# Patient Record
Sex: Female | Born: 1995
Health system: Southern US, Community
[De-identification: ages and names within clinical notes are randomized; demographics above are authoritative.]

## PROBLEM LIST (undated history)

## (undated) ENCOUNTER — Inpatient Hospital Stay (HOSPITAL_COMMUNITY): Payer: Self-pay

## (undated) HISTORY — PX: INDUCED ABORTION: SHX677

## (undated) HISTORY — PX: WISDOM TOOTH EXTRACTION: SHX21

---
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
## 2013-10-08 ENCOUNTER — Encounter (HOSPITAL_COMMUNITY): Payer: Self-pay | Admitting: *Deleted

## 2013-11-13 ENCOUNTER — Emergency Department (HOSPITAL_COMMUNITY)
Admission: EM | Admit: 2013-11-13 | Discharge: 2013-11-13 | Disposition: A | Discharge status: Home / Self Care | Payer: Medicaid Other | Attending: Emergency Medicine | Admitting: Emergency Medicine

## 2013-11-13 ENCOUNTER — Encounter (HOSPITAL_COMMUNITY): Payer: Self-pay | Admitting: Emergency Medicine

## 2013-11-13 DIAGNOSIS — N925 Other specified irregular menstruation: Secondary | ICD-10-CM | POA: Insufficient documentation

## 2013-11-13 DIAGNOSIS — N949 Unspecified condition associated with female genital organs and menstrual cycle: Secondary | ICD-10-CM | POA: Insufficient documentation

## 2013-11-13 DIAGNOSIS — Z3202 Encounter for pregnancy test, result negative: Secondary | ICD-10-CM | POA: Insufficient documentation

## 2013-11-13 DIAGNOSIS — N938 Other specified abnormal uterine and vaginal bleeding: Secondary | ICD-10-CM | POA: Insufficient documentation

## 2013-11-13 LAB — I-STAT CHEM 8, ED
BUN: 9 mg/dL (ref 6–23)
Calcium, Ion: 1.29 mmol/L — ABNORMAL HIGH (ref 1.12–1.23)
Chloride: 101 mEq/L (ref 96–112)
Creatinine, Ser: 0.9 mg/dL (ref 0.50–1.10)
Glucose, Bld: 90 mg/dL (ref 70–99)
HCT: 44 % (ref 36.0–46.0)
HEMOGLOBIN: 15 g/dL (ref 12.0–15.0)
POTASSIUM: 3.9 meq/L (ref 3.7–5.3)
SODIUM: 142 meq/L (ref 137–147)
TCO2: 28 mmol/L (ref 0–100)

## 2013-11-13 LAB — PREGNANCY, URINE: PREG TEST UR: NEGATIVE

## 2013-11-13 LAB — WET PREP, GENITAL
Clue Cells Wet Prep HPF POC: NONE SEEN
TRICH WET PREP: NONE SEEN
YEAST WET PREP: NONE SEEN

## 2013-11-13 MED ORDER — ACETAMINOPHEN 500 MG PO TABS
1000.0000 mg | ORAL_TABLET | Freq: Once | ORAL | Status: AC
  Administered 2013-11-13: 1000 mg via ORAL
  Filled 2013-11-13: qty 2

## 2013-11-13 NOTE — ED Provider Notes (Signed)
CSN: [id number]     Arrival date & time 11/13/13  1051 History  This chart was scribed for non-physician practitioner, Wynetta EmeryNicole Pisciotta, PA-C,working with Vanetta MuldersScott Zackowski, MD, by Karle PlumberJennifer Tensley, ED Scribe.  This patient was seen in room WA25/WA25 and the patient's care was started at 12:59 PM.  Chief Complaint  Patient presents with  . Vaginal Bleeding  . Abdominal Cramping   The history is provided by the patient. No language interpreter was used.   HPI Comments:  Megan OliphantJasmine T Aker is a 18 y.o. female who presents to the Emergency Department complaining of heavy bleeding with clots and abdominal pain that started two days ago. She reports her pain is a 7/10.  Pt states she normally has irregular, light periods. She states she was soaking through one pad per hour starting yesterday morning. She states the bleeding gradually lessened throughout the day but is still heavy. She states she has been wearing the same pad this morning without soaking through it. She reports taking one Ibuprofen yesterday with no relief. She denies taking anything for pain today. Pt reports normal urination and bowel movements. She denies heart palpitations, CP, SOB, nausea, or vomiting. Pt reports she does not regularly see an OB/GYN. Her LMP ended on June 1st and she reports it being light and lasting approximately four days.   Past Medical History  Diagnosis Date  . Premature delivery   . Preterm labor    Past Surgical History  Procedure Laterality Date  . No past surgeries     Family History  Problem Relation Age of Onset  . Other Neg Hx    History  Substance Use Topics  . Smoking status: Never Smoker   . Smokeless tobacco: Never Used  . Alcohol Use: No   OB History   Grav Para Term Preterm Abortions TAB SAB Ect Mult Living   3 1  1 1  0 0 0  1     Review of Systems A complete 10 system review of systems was obtained and all systems are negative except as noted in the HPI and PMH.   Allergies   Review of patient's allergies indicates no known allergies.  Home Medications   Prior to Admission medications   Medication Sig Start Date End Date Taking? Authorizing Tanaja Ganger  ibuprofen (ADVIL,MOTRIN) 200 MG tablet Take 400 mg by mouth every 6 (six) hours as needed for fever or moderate pain.   Yes Historical Rasheedah Reis, MD   Triage Vitals: BP 115/78  Pulse 78  Temp(Src) 98.7 F (37.1 C) (Oral)  Resp 16  SpO2 100%  LMP 10/13/2013  Breastfeeding? No Physical Exam  Nursing note and vitals reviewed. Constitutional: She is oriented to person, place, and time. She appears well-developed and well-nourished.  HENT:  Head: Normocephalic and atraumatic.  Mouth/Throat: Oropharynx is clear and moist.  No conjunctival pallor  Eyes: EOM are normal.  Neck: Normal range of motion.  Cardiovascular: Normal rate, regular rhythm and intact distal pulses.   Pulmonary/Chest: Effort normal and breath sounds normal. No respiratory distress. She has no wheezes. She has no rales. She exhibits no tenderness.  Abdominal: Soft. She exhibits no distension and no mass. There is tenderness. There is no rebound and no guarding.  Mild tenderness to deep palpation in the left lower cord drug with no guarding or rebound  Genitourinary:  No rashes or lesions, dark blood pooled in the posterior fourchette. No bleeding observed from cervix. No cervical abnormalities. There is no cervical motion or adnexal  tenderness.  Musculoskeletal: Normal range of motion.  Neurological: She is alert and oriented to person, place, and time.  Skin: Skin is warm and dry.  Psychiatric: She has a normal mood and affect. Her behavior is normal.    ED Course  Procedures (including critical care time) DIAGNOSTIC STUDIES: Oxygen Saturation is 100% on RA, normal by my interpretation.   COORDINATION OF CARE: 1:04 PM- Will order blood work and perform pelvic exam. Pt verbalizes understanding and agrees to plan.  Medications   acetaminophen (TYLENOL) tablet 1,000 mg (1,000 mg Oral Given 11/13/13 1325)    Labs Review Labs Reviewed  WET PREP, GENITAL - Abnormal; Notable for the following:    WBC, Wet Prep HPF POC FEW (*)    All other components within normal limits  I-STAT CHEM 8, ED - Abnormal; Notable for the following:    Calcium, Ion 1.29 (*)    All other components within normal limits  GC/CHLAMYDIA PROBE AMP  PREGNANCY, URINE    Imaging Review No results found.   EKG Interpretation None      MDM   Final diagnoses:  DUB (dysfunctional uterine bleeding)    Filed Vitals:   11/13/13 1109  BP: 115/78  Pulse: 78  Temp: 98.7 F (37.1 C)  TempSrc: Oral  Resp: 16  SpO2: 100%    Medications  acetaminophen (TYLENOL) tablet 1,000 mg (1,000 mg Oral Given 11/13/13 1325)    Megan OliphantJasmine T [name] is a 18 y.o. female presenting with cramping and heavy vaginal bleeding. Patient is not pregnant, there are no signs of anemia. Wet prep with no abnormalities. I advised patient to follow with OB/GYN.  Evaluation does not show pathology that would require ongoing emergent intervention or inpatient treatment. Pt is hemodynamically stable and mentating appropriately. Discussed findings and plan with patient/guardian, who agrees with care plan. All questions answered. Return precautions discussed and outpatient follow up given.   I personally performed the services described in this documentation, which was scribed in my presence. The recorded information has been reviewed and is accurate.    Wynetta Emeryicole Pisciotta, PA-C 11/13/13 1545

## 2013-11-13 NOTE — ED Provider Notes (Signed)
Medical screening examination/treatment/procedure(s) were performed by non-physician practitioner and as supervising physician I was immediately available for consultation/collaboration.   EKG Interpretation None        Vanetta MuldersScott Zackowski, MD 11/13/13 1549

## 2013-11-13 NOTE — ED Notes (Signed)
Pt was told she would be d/c after wet prep resulted. Patient not in room upon re-assessment. [facility] aware.

## 2013-11-13 NOTE — Discharge Instructions (Signed)
Do not hesitate to return to the Emergency Department for any new, worsening or concerning symptoms.   If you do not have a primary care doctor you can establish one at the   [facility]: [address] [place] [id number] [phone]  After you establish care. Let them know you were seen in the emergency room. They must obtain records for further management.    Abnormal Uterine Bleeding Abnormal uterine bleeding can affect women at various stages in life, including teenagers, women in their reproductive years, pregnant women, and women who have reached menopause. Several kinds of uterine bleeding are considered abnormal, including:  Bleeding or spotting between periods.   Bleeding after sexual intercourse.   Bleeding that is heavier or more than normal.   Periods that last longer than usual.  Bleeding after menopause.  Many cases of abnormal uterine bleeding are minor and simple to treat, while others are more serious. Any type of abnormal bleeding should be evaluated by your health care provider. Treatment will depend on the cause of the bleeding. HOME CARE INSTRUCTIONS Monitor your condition for any changes. The following actions may help to alleviate any discomfort you are experiencing:  Avoid the use of tampons and douches as directed by your health care provider.  Change your pads frequently. You should get regular pelvic exams and Pap tests. Keep all follow-up appointments for diagnostic tests as directed by your health care provider.  SEEK MEDICAL CARE IF:   Your bleeding lasts more than 1 week.   You feel dizzy at times.  SEEK IMMEDIATE MEDICAL CARE IF:   You pass out.   You are changing pads every 15 to 30 minutes.   You have abdominal pain.  You have a fever.   You become sweaty or weak.   You are passing large blood clots from the vagina.   You start to feel nauseous and vomit. MAKE SURE YOU:   Understand these  instructions.  Will watch your condition.  Will get help right away if you are not doing well or get worse. Document Released: 05/05/2005 Document Revised: 05/10/2013 Document Reviewed: 12/02/2012 [facility] Patient Information 2015 [place]. This information is not intended to replace advice given to you by your health care provider. Make sure you discuss any questions you have with your health care provider.

## 2013-11-13 NOTE — ED Notes (Signed)
Ambulated Pt to restroom. Never saw Pt return to room. Went to go into room to update VS and pt was gone. Notified RN [employer]

## 2013-11-13 NOTE — ED Notes (Addendum)
Pt from home c/o heavy vaginal bleeding with abd cramping x1 day. Pt states that she had her LMP June 1st and should not have started yet. Pt adds that she has never had heavy bleeding or pain before. Pt is A&O and in NAD. Pt is using approx 2 pads per hour. Pt states that she had abortion July 2014.

## 2013-11-14 LAB — GC/CHLAMYDIA PROBE AMP
CT Probe RNA: NEGATIVE
GC Probe RNA: NEGATIVE

## 2014-02-07 ENCOUNTER — Emergency Department (HOSPITAL_COMMUNITY): Payer: Self-pay

## 2014-02-07 ENCOUNTER — Ambulatory Visit (HOSPITAL_COMMUNITY): Payer: Medicaid Other

## 2014-02-07 ENCOUNTER — Emergency Department (HOSPITAL_COMMUNITY)
Admission: EM | Admit: 2014-02-07 | Discharge: 2014-02-07 | Discharge status: Against Medical Advice | Payer: Medicaid Other | Attending: Emergency Medicine | Admitting: Emergency Medicine

## 2014-02-07 ENCOUNTER — Encounter (HOSPITAL_COMMUNITY): Payer: Self-pay | Admitting: Emergency Medicine

## 2014-02-07 DIAGNOSIS — L293 Anogenital pruritus, unspecified: Secondary | ICD-10-CM | POA: Insufficient documentation

## 2014-02-07 DIAGNOSIS — N939 Abnormal uterine and vaginal bleeding, unspecified: Secondary | ICD-10-CM

## 2014-02-07 DIAGNOSIS — N898 Other specified noninflammatory disorders of vagina: Secondary | ICD-10-CM

## 2014-02-07 DIAGNOSIS — Z3202 Encounter for pregnancy test, result negative: Secondary | ICD-10-CM | POA: Insufficient documentation

## 2014-02-07 LAB — COMPREHENSIVE METABOLIC PANEL
ALT: 17 U/L (ref 0–35)
AST: 16 U/L (ref 0–37)
Albumin: 3.8 g/dL (ref 3.5–5.2)
Alkaline Phosphatase: 74 U/L (ref 39–117)
Anion gap: 14 (ref 5–15)
BILIRUBIN TOTAL: 0.4 mg/dL (ref 0.3–1.2)
BUN: 11 mg/dL (ref 6–23)
CHLORIDE: 102 meq/L (ref 96–112)
CO2: 23 meq/L (ref 19–32)
Calcium: 9.7 mg/dL (ref 8.4–10.5)
Creatinine, Ser: 0.77 mg/dL (ref 0.50–1.10)
GFR calc Af Amer: >90 mL/min (ref >90)
Glucose, Bld: 103 mg/dL — ABNORMAL HIGH (ref 70–99)
POTASSIUM: 4.4 meq/L (ref 3.7–5.3)
SODIUM: 139 meq/L (ref 137–147)
Total Protein: 8.2 g/dL (ref 6.0–8.3)

## 2014-02-07 LAB — URINALYSIS, ROUTINE W REFLEX MICROSCOPIC
Bilirubin Urine: NEGATIVE
GLUCOSE, UA: NEGATIVE mg/dL
KETONES UR: NEGATIVE mg/dL
Nitrite: NEGATIVE
PH: 6 (ref 5.0–8.0)
Protein, ur: 30 mg/dL — AB
Specific Gravity, Urine: 1.035 — ABNORMAL HIGH (ref 1.005–1.030)
Urobilinogen, UA: 1 mg/dL (ref 0.0–1.0)

## 2014-02-07 LAB — CBC WITH DIFFERENTIAL/PLATELET
BASOS ABS: 0 10*3/uL (ref 0.0–0.1)
Basophils Relative: 0 % (ref 0–1)
Eosinophils Absolute: 0.1 10*3/uL (ref 0.0–0.7)
Eosinophils Relative: 1 % (ref 0–5)
HCT: 39.6 % (ref 36.0–46.0)
Hemoglobin: 12.5 g/dL (ref 12.0–15.0)
LYMPHS PCT: 44 % (ref 12–46)
Lymphs Abs: 3.8 10*3/uL (ref 0.7–4.0)
MCH: 23.1 pg — ABNORMAL LOW (ref 26.0–34.0)
MCHC: 31.6 g/dL (ref 30.0–36.0)
MCV: 73.1 fL — ABNORMAL LOW (ref 78.0–100.0)
Monocytes Absolute: 0.6 10*3/uL (ref 0.1–1.0)
Monocytes Relative: 7 % (ref 3–12)
NEUTROS ABS: 4.2 10*3/uL (ref 1.7–7.7)
Neutrophils Relative %: 48 % (ref 43–77)
PLATELETS: 473 10*3/uL — AB (ref 150–400)
RBC: 5.42 MIL/uL — ABNORMAL HIGH (ref 3.87–5.11)
RDW: 16.5 % — AB (ref 11.5–15.5)
WBC: 8.7 10*3/uL (ref 4.0–10.5)

## 2014-02-07 LAB — WET PREP, GENITAL
CLUE CELLS WET PREP: NONE SEEN
Trich, Wet Prep: NONE SEEN
Yeast Wet Prep HPF POC: NONE SEEN

## 2014-02-07 LAB — URINE MICROSCOPIC-ADD ON

## 2014-02-07 LAB — POC URINE PREG, ED: Preg Test, Ur: NEGATIVE

## 2014-02-07 MED ORDER — SODIUM CHLORIDE 0.9 % IV BOLUS (SEPSIS): 1000.0000 mL | Freq: Once | INTRAVENOUS | Status: DC

## 2014-02-07 MED ORDER — LIDOCAINE HCL 1 % IJ SOLN
INTRAMUSCULAR | Status: AC
  Administered 2014-02-07: 20 mL
  Filled 2014-02-07: qty 20

## 2014-02-07 MED ORDER — CEFTRIAXONE SODIUM 250 MG IJ SOLR
250.0000 mg | Freq: Once | INTRAMUSCULAR | Status: AC
  Administered 2014-02-07: 250 mg via INTRAMUSCULAR
  Filled 2014-02-07: qty 250

## 2014-02-07 MED ORDER — AZITHROMYCIN 250 MG PO TABS
1000.0000 mg | ORAL_TABLET | Freq: Once | ORAL | Status: AC
  Administered 2014-02-07: 1000 mg via ORAL
  Filled 2014-02-07: qty 4

## 2014-02-07 NOTE — ED Notes (Signed)
Pt does not want any further tests and treatment at this time--- stated that she wanted to go home right now because her "ride is here and it's the one and only ride she has".

## 2014-02-07 NOTE — ED Provider Notes (Signed)
CSN: [id number]     Arrival date & time 02/07/14  1706 History   First MD Initiated Contact with Patient 02/07/14 1818     Chief Complaint  Patient presents with  . Vaginal Itching  . Vaginal Bleeding     (Consider location/radiation/quality/duration/timing/severity/associated sxs/prior Treatment) The history is provided by the patient. No language interpreter was used.  Megan [name] is a 18 y/o F with PMHx of premature delivery and labor presenting to the ED with vaginal bleeding and vaginal itching. Patient reported that the vaginal bleeding started 3 days ago - reported that she has been having to wear a pad and change it at least 2 times per day. Reported that she just finished having an episode of vaginal bleeding earlier in the month, stated that she had an episode of vaginal bleeding that lasted approximately 4 months - reported that she was never followed up with OBGYN or had an US performed. Reported that her menstrual cycle has always been irregular. Stated that she started to have vaginal itching yesterday and stated that she also started to experience burning with urination - believes this to be from the vaginal pruritis. Stated that she has history of Chlamydia and is concerned that she is infected again - reported that she was diagnosed with this approximately 2 years ago. Reported that she is sexually active, stated that she does use protection - last encounter 2 weeks ago. Denied being on birth control. Patient requested to be tested for STDs. Denied abdominal cramping, abdominal pain, fever, chills, nausea, vomiting, diarrhea, hematochezia, dizziness, headache, blurred vision, sudden loss of vision, vaginal pain, vaginal discharge. PCP none  Past Medical History  Diagnosis Date  . Premature delivery   . Preterm labor    Past Surgical History  Procedure Laterality Date  . No past surgeries     Family History  Problem Relation Age of Onset  . Other Neg Hx    History   Substance Use Topics  . Smoking status: Never Smoker   . Smokeless tobacco: Never Used  . Alcohol Use: No   OB History   Grav Para Term Preterm Abortions TAB SAB Ect Mult Living   0 0 0  1     Review of Systems  Constitutional: Negative for fever and chills.  Respiratory: Negative for chest tightness and shortness of breath.   Cardiovascular: Negative for chest pain.  Gastrointestinal: Negative for nausea, vomiting, abdominal pain, diarrhea, constipation, blood in stool and anal bleeding.  Genitourinary: Positive for dysuria and vaginal bleeding. Negative for hematuria, vaginal discharge, vaginal pain and pelvic pain.  Musculoskeletal: Positive for back pain.  Neurological: Negative for dizziness, weakness and headaches.      Allergies  Review of patient's allergies indicates no known allergies.  Home Medications   Prior to Admission medications   Not on File   BP 115/70  Pulse 70  Temp(Src) 98 F (36.7 C) (Oral)  Resp 16  SpO2 100%  LMP 01/20/2014 Physical Exam  Nursing note and vitals reviewed. Constitutional: She is oriented to person, place, and time. She appears well-developed and well-nourished. No distress.  HENT:  Head: Normocephalic and atraumatic.  Mouth/Throat: Oropharynx is clear and moist. No oropharyngeal exudate.  Eyes: Conjunctivae and EOM are normal. Pupils are equal, round, and reactive to light. Right eye exhibits no discharge. Left eye exhibits no discharge.  Neck: Normal range of motion. Neck supple. No tracheal deviation present.  Cardiovascular: Normal rate, regular rhythm  and normal heart sounds.  Exam reveals no friction rub.   No murmur heard. Pulses:      Radial pulses are 2+ on the right side, and 2+ on the left side.       Dorsalis pedis pulses are 2+ on the right side, and 2+ on the left side.  Pulmonary/Chest: Effort normal and breath sounds normal. No respiratory distress. She has no wheezes. She has no rales.  Patient is  able to speak in full sentences without difficulty  Negative use of accessory muscles Negative stridor  Abdominal: Soft. Bowel sounds are normal. She exhibits no distension. There is no tenderness. There is no rebound and no guarding.  Negative abdominal distension  BS normoactive in all 4 quadrants Abdomen soft upon palpation  Negative pain upon palpation to the abdomen  Negative peritoneal signs, guarding, rigidity   Genitourinary:  Pelvic Exam: Negative swelling, erythema, inflammation, lesions, sores, deformities identified to the external genitalia and vaginal canal. Bright red blood noted in the vaginal vault coming from the cervix. Negative vaginal discharge noted. Positive CMT tenderness and discomfort in the suprapubic region when pressure is applied. Negative bilateral adnexal tenderness. Exam chaperoned with Tech   Musculoskeletal: Normal range of motion.  Full ROM to upper and lower extremities without difficulty noted, negative ataxia noted.  Lymphadenopathy:    She has no cervical adenopathy.  Neurological: She is alert and oriented to person, place, and time. No cranial nerve deficit. She exhibits normal muscle tone. Coordination normal.  Skin: Skin is warm and dry. No rash noted. She is not diaphoretic. No erythema.  Psychiatric: She has a normal mood and affect. Her behavior is normal. Thought content normal.    ED Course  Procedures (including critical care time)  8:13 PM This provider was made aware that the patient needs to leave. Reported that her ride is here and that she needs to go and can no longer stay for further imaging/labs to be performed.   Results for orders placed during the hospital encounter of 02/07/14  WET PREP, GENITAL      Result Value Ref Range   Yeast Wet Prep HPF POC NONE SEEN  NONE SEEN   Trich, Wet Prep NONE SEEN  NONE SEEN   Clue Cells Wet Prep HPF POC NONE SEEN  NONE SEEN   WBC, Wet Prep HPF POC RARE (*) NONE SEEN  CBC WITH DIFFERENTIAL       Result Value Ref Range   WBC 8.7  4.0 - 10.5 K/uL   RBC 5.42 (*) 3.87 - 5.11 MIL/uL   Hemoglobin 12.5  12.0 - 15.0 g/dL   HCT 16.1  09.6 - 04.5 %   MCV 73.1 (*) 78.0 - 100.0 fL   MCH 23.1 (*) 26.0 - 34.0 pg   MCHC 31.6  30.0 - 36.0 g/dL   RDW 40.9 (*) 81.1 - 91.4 %   Platelets 473 (*) 150 - 400 K/uL   Neutrophils Relative % 48  43 - 77 %   Neutro Abs 4.2  1.7 - 7.7 K/uL   Lymphocytes Relative 44  12 - 46 %   Lymphs Abs 3.8  0.7 - 4.0 K/uL   Monocytes Relative 7  3 - 12 %   Monocytes Absolute 0.6  0.1 - 1.0 K/uL   Eosinophils Relative 1  0 - 5 %   Eosinophils Absolute 0.1  0.0 - 0.7 K/uL   Basophils Relative 0  0 - 1 %   Basophils Absolute 0.0  0.0 - 0.1 K/uL  COMPREHENSIVE METABOLIC PANEL      Result Value Ref Range   Sodium 139  137 - 147 mEq/L   Potassium 4.4  3.7 - 5.3 mEq/L   Chloride 102  96 - 112 mEq/L   CO2 23  19 - 32 mEq/L   Glucose, Bld 103 (*) 70 - 99 mg/dL   BUN 11  6 - 23 mg/dL   Creatinine, Ser 1.61  0.50 - 1.10 mg/dL   Calcium 9.7  8.4 - 09.6 mg/dL   Total Protein 8.2  6.0 - 8.3 g/dL   Albumin 3.8  3.5 - 5.2 g/dL   AST 16  0 - 37 U/L   ALT 17  0 - 35 U/L   Alkaline Phosphatase 74  39 - 117 U/L   Total Bilirubin 0.4  0.3 - 1.2 mg/dL   GFR calc non Af Amer >90  >90 mL/min   GFR calc Af Amer >90  >90 mL/min   Anion gap 14  5 - 15  URINALYSIS, ROUTINE W REFLEX MICROSCOPIC      Result Value Ref Range   Color, Urine AMBER (*) YELLOW   APPearance CLOUDY (*) CLEAR   Specific Gravity, Urine 1.035 (*) 1.005 - 1.030   pH 6.0  5.0 - 8.0   Glucose, UA NEGATIVE  NEGATIVE mg/dL   Hgb urine dipstick LARGE (*) NEGATIVE   Bilirubin Urine NEGATIVE  NEGATIVE   Ketones, ur NEGATIVE  NEGATIVE mg/dL   Protein, ur 30 (*) NEGATIVE mg/dL   Urobilinogen, UA 1.0  0.0 - 1.0 mg/dL   Nitrite NEGATIVE  NEGATIVE   Leukocytes, UA MODERATE (*) NEGATIVE  URINE MICROSCOPIC-ADD ON      Result Value Ref Range   RBC / HPF TOO NUMEROUS TO COUNT  <3 RBC/hpf   Urine-Other FIELD  OBSCURED BY RBC'S    POC URINE PREG, ED      Result Value Ref Range   Preg Test, Ur NEGATIVE  NEGATIVE    Labs Review Labs Reviewed  WET PREP, GENITAL - Abnormal; Notable for the following:    WBC, Wet Prep HPF POC RARE (*)    All other components within normal limits  CBC WITH DIFFERENTIAL - Abnormal; Notable for the following:    RBC 5.42 (*)    MCV 73.1 (*)    MCH 23.1 (*)    RDW 16.5 (*)    Platelets 473 (*)    All other components within normal limits  COMPREHENSIVE METABOLIC PANEL - Abnormal; Notable for the following:    Glucose, Bld 103 (*)    All other components within normal limits  URINALYSIS, ROUTINE W REFLEX MICROSCOPIC - Abnormal; Notable for the following:    Color, Urine AMBER (*)    APPearance CLOUDY (*)    Specific Gravity, Urine 1.035 (*)    Hgb urine dipstick LARGE (*)    Protein, ur 30 (*)    Leukocytes, UA MODERATE (*)    All other components within normal limits  GC/CHLAMYDIA PROBE AMP  URINE CULTURE  URINE MICROSCOPIC-ADD ON  RPR  HIV ANTIBODY (ROUTINE TESTING)  POC URINE PREG, ED    Imaging Review No results found.   EKG Interpretation None      MDM   Final diagnoses:  Abnormal uterine bleeding  Vaginal itching    Medications  sodium chloride 0.9 % bolus 1,000 mL (1,000 mLs Intravenous Not Given 02/07/14 2020)  cefTRIAXone (ROCEPHIN) injection 250 mg (250 mg Intramuscular Given  02/07/14 2019)  azithromycin (ZITHROMAX) tablet 1,000 mg (1,000 mg Oral Given 02/07/14 2018)  lidocaine (XYLOCAINE) 1 % (with pres) injection (20 mLs  Given 02/07/14 2016)   Filed Vitals:   02/07/14 1726 02/07/14 1936  BP: 121/84 115/70  Pulse: 69 70  Temp: 98.1 F (36.7 C) 98 F (36.7 C)  TempSrc: Oral Oral  Resp: 16 16  SpO2: 100% 100%    CBC unremarkable-hemoglobin and hematocrit within normal limits. CMP unremarkable. Urine pregnancy negative. Urinalysis noted moderate leukocytes with large amount of hemoglobin-patient is having menstrual bleeding.  Wet prep unremarkable. GC Chlamydia probe, RPR, HIV, urine culture pending. This provider recommended further imaging to be performed and for patient to stay for [place] to be performed regarding abnormal uterine bleeding. Patient unable to stay, stated that she can no longer stay secondary to her ride being here and she has no other way of getting home. Discussed with patient concern and imaging - patient declined. Prophylactic STD medications given. Patient to be signed out AMA. Recommended patient to follow-up with [facility]. Discussed with patient to closely monitor symptoms and if symptoms are to worsen or change to report back to the ED - strict return instructions given.   Raymon Mutton, PA-C 02/07/14 2045

## 2014-02-07 NOTE — ED Notes (Signed)
Per patient, states vaginal bleeding and itching for 3 days

## 2014-02-07 NOTE — Discharge Instructions (Signed)
Please call your doctor for a followup appointment within 24-48 hours. When you talk to your doctor please let them know that you were seen in the emergency department and have them acquire all of your records so that they can discuss the findings with you and formulate a treatment plan to fully care for your new and ongoing problems. Please call and set-up an appointment with OBGYN  Please rest and stay hydrated Please continue to monitor symptoms closely and if symptoms are to worsen or change (fever greater than 101, chills, sweating, nausea, vomiting, chest pain, shortness of breathe, difficulty breathing, weakness, numbness, tingling, worsening or changes to pain pattern, worsening or changes to vaginal bleeding, vaginal pain, abdominal cramping, black tarry stools) please report back to the Emergency Department immediately.    Abnormal Uterine Bleeding Abnormal uterine bleeding can affect women at various stages in life, including teenagers, women in their reproductive years, pregnant women, and women who have reached menopause. Several kinds of uterine bleeding are considered abnormal, including:  Bleeding or spotting between periods.   Bleeding after sexual intercourse.   Bleeding that is heavier or more than normal.   Periods that last longer than usual.  Bleeding after menopause.  Many cases of abnormal uterine bleeding are minor and simple to treat, while others are more serious. Any type of abnormal bleeding should be evaluated by your health care provider. Treatment will depend on the cause of the bleeding. HOME CARE INSTRUCTIONS Monitor your condition for any changes. The following actions may help to alleviate any discomfort you are experiencing:  Avoid the use of tampons and douches as directed by your health care provider.  Change your pads frequently. You should get regular pelvic exams and Pap tests. Keep all follow-up appointments for diagnostic tests as directed by  your health care provider.  SEEK MEDICAL CARE IF:   Your bleeding lasts more than 1 week.   You feel dizzy at times.  SEEK IMMEDIATE MEDICAL CARE IF:   You pass out.   You are changing pads every 15 to 30 minutes.   You have abdominal pain.  You have a fever.   You become sweaty or weak.   You are passing large blood clots from the vagina.   You start to feel nauseous and vomit. MAKE SURE YOU:   Understand these instructions.  Will watch your condition.  Will get help right away if you are not doing well or get worse. Document Released: 05/05/2005 Document Revised: 05/10/2013 Document Reviewed: 12/02/2012 [facility] Patient Information 2015 [place], [place]. This information is not intended to replace advice given to you by your health care provider. Make sure you discuss any questions you have with your health care provider.

## 2014-02-07 NOTE — ED Provider Notes (Signed)
Medical screening examination/treatment/procedure(s) were performed by non-physician practitioner and as supervising physician I was immediately available for consultation/collaboration.   EKG Interpretation None       Juliet Rude. Rubin Payor, MD 02/07/14 [phone]

## 2014-02-08 LAB — GC/CHLAMYDIA PROBE AMP
CT Probe RNA: NEGATIVE
GC Probe RNA: POSITIVE — AB

## 2014-02-08 LAB — RPR

## 2014-02-08 LAB — HIV ANTIBODY (ROUTINE TESTING W REFLEX): HIV 1&2 Ab, 4th Generation: NONREACTIVE

## 2014-02-09 ENCOUNTER — Telehealth (HOSPITAL_COMMUNITY): Payer: Self-pay

## 2014-02-09 LAB — URINE CULTURE
Colony Count: 70000
SPECIAL REQUESTS: NORMAL

## 2014-02-09 NOTE — ED Notes (Incomplete)
Positive gonorrhea with no antibiotics. Chart sent to EDP office for review.

## 2014-02-20 ENCOUNTER — Telehealth (HOSPITAL_BASED_OUTPATIENT_CLINIC_OR_DEPARTMENT_OTHER): Payer: Self-pay

## 2014-02-20 NOTE — Telephone Encounter (Signed)
Pt called after receiving letter.  ID verified x 2.  Pt (+) for gonorrhea.  Was txd appropriately w/Zithromax and Rocephin on her 9/22 visit to [facility].  Pt advised to let partner(s) know so they can tested and treated and sh has not been sexually active since treatment.  [employer] form completed and faxed.

## 2014-02-21 ENCOUNTER — Telehealth (HOSPITAL_BASED_OUTPATIENT_CLINIC_OR_DEPARTMENT_OTHER): Payer: Self-pay | Admitting: Emergency Medicine

## 2014-03-20 ENCOUNTER — Encounter (HOSPITAL_COMMUNITY): Payer: Self-pay | Admitting: Emergency Medicine

## 2014-03-25 IMAGING — US US OB TRANSVAGINAL
1 series · 14 of 28 positions shown · non-contrast
Comparison: 03/08/2012.

CLINICAL DATA: Assess viability

OBSTETRIC <14 WK ULTRASOUND
TECHNIQUE: Transvaginal ultrasound was performed for evaluation
of the gestation as well as the maternal uterus and adnexal
regions.

[Series 1: us ob transvaginal · 14 of 51 slices shown]
[im 2/51]
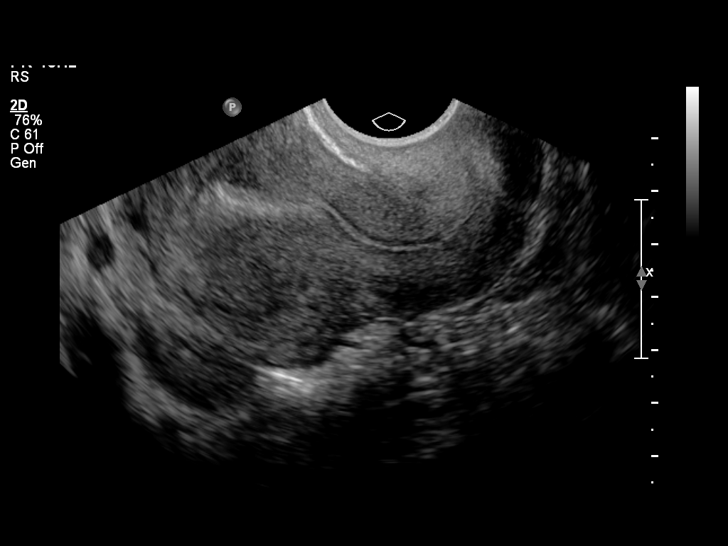
[im 6/51]
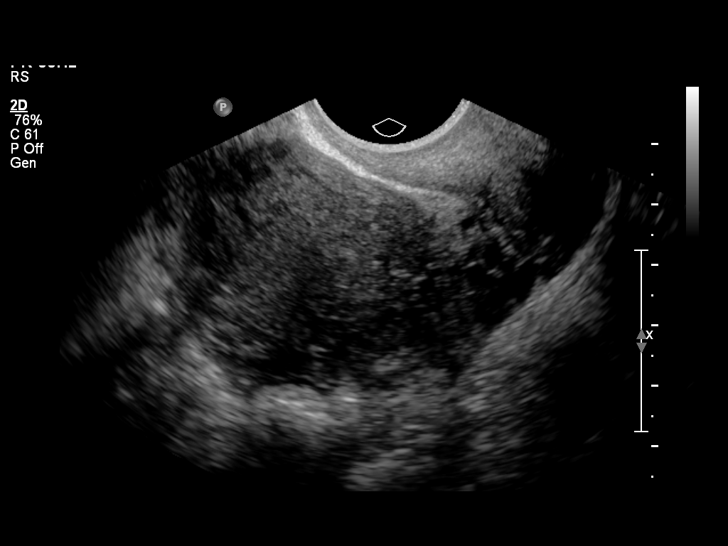
[im 10/51]
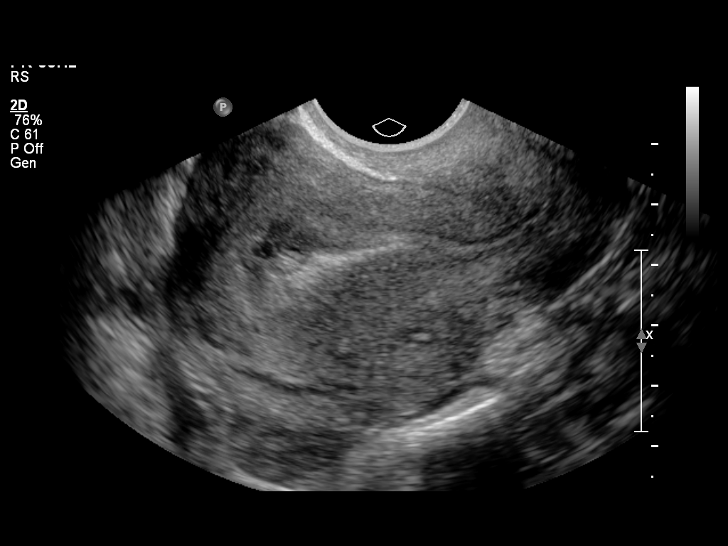
[im 13/51]
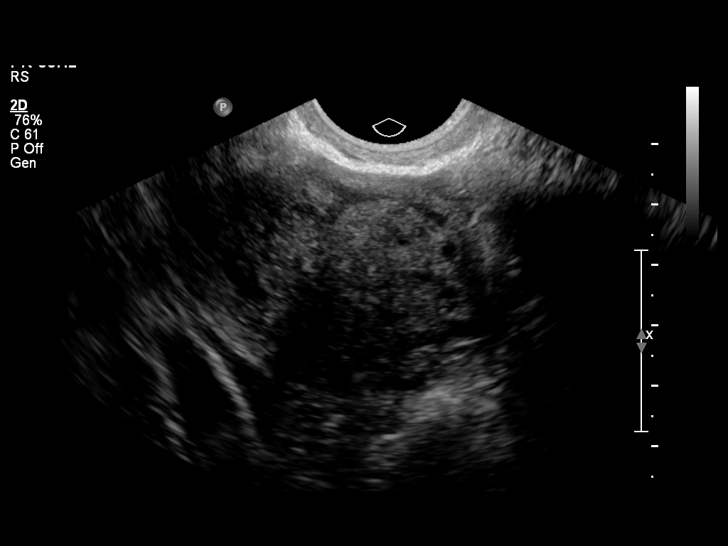
[im 17/51]
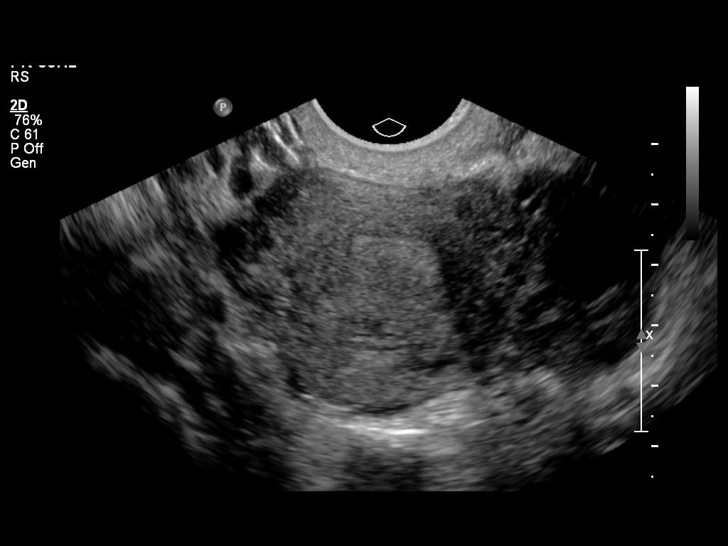
[im 21/51]
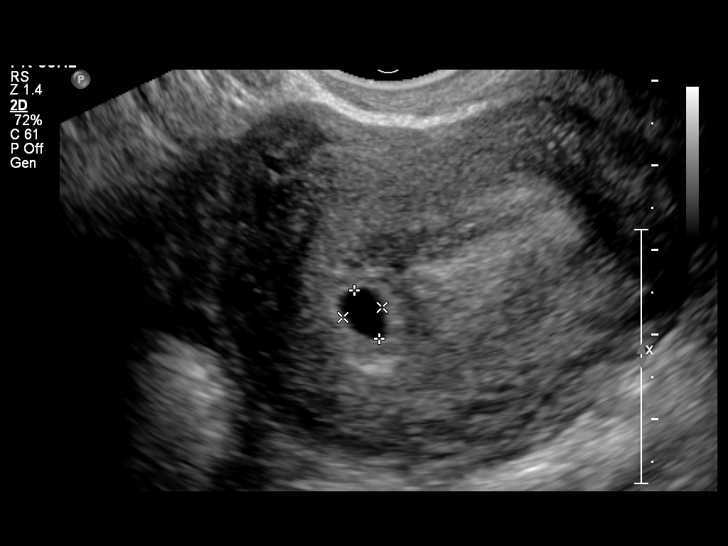
[im 25/51]
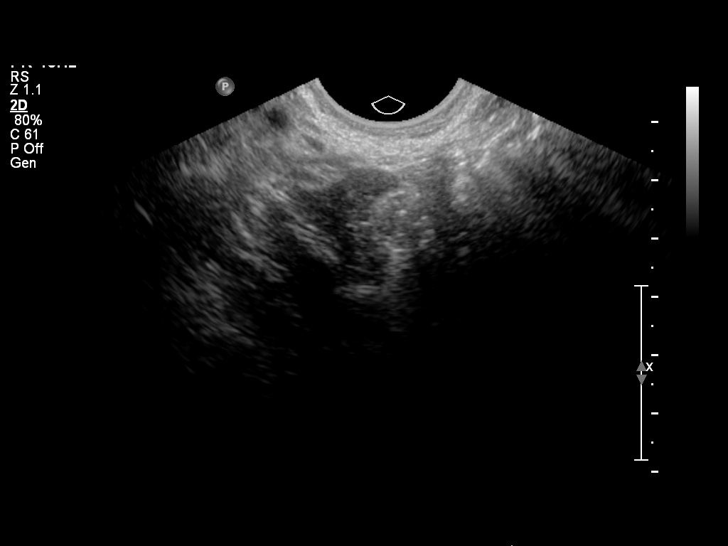
[im 28/51]
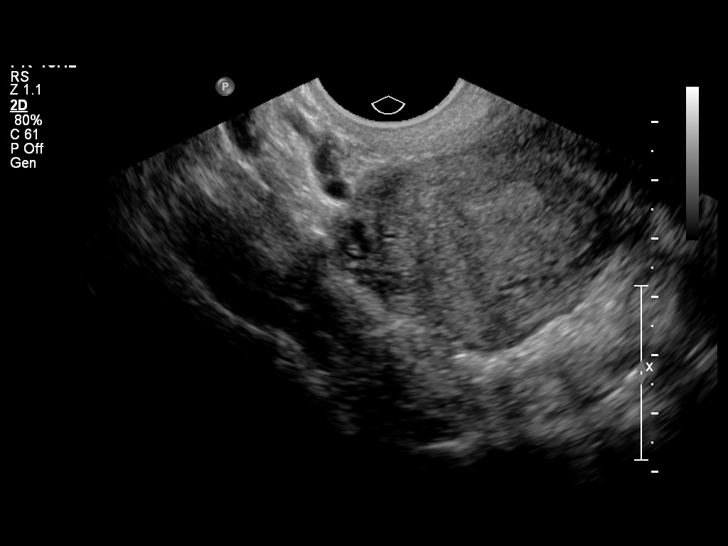
[im 32/51]
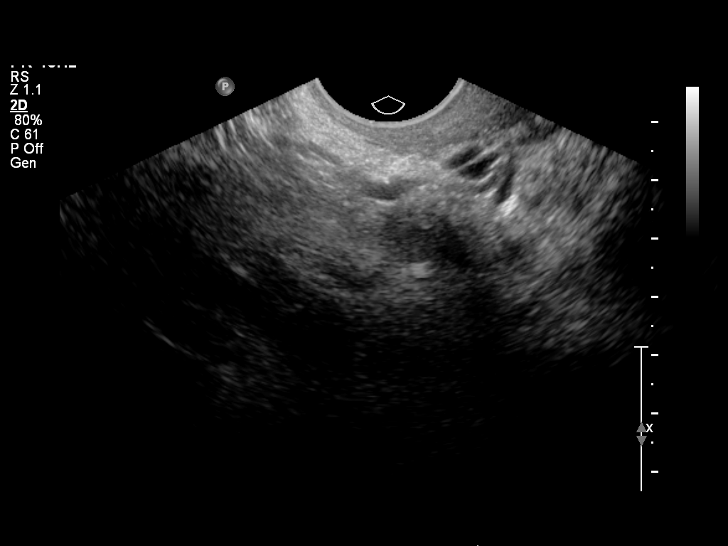
[im 36/51]
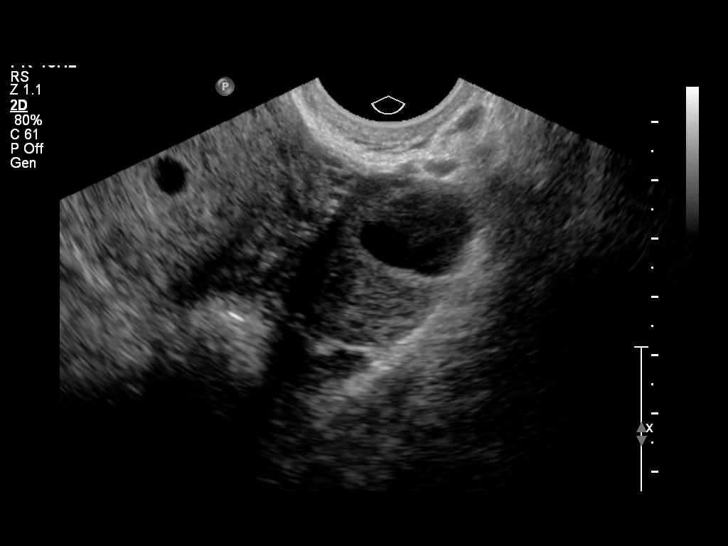
[im 39/51]
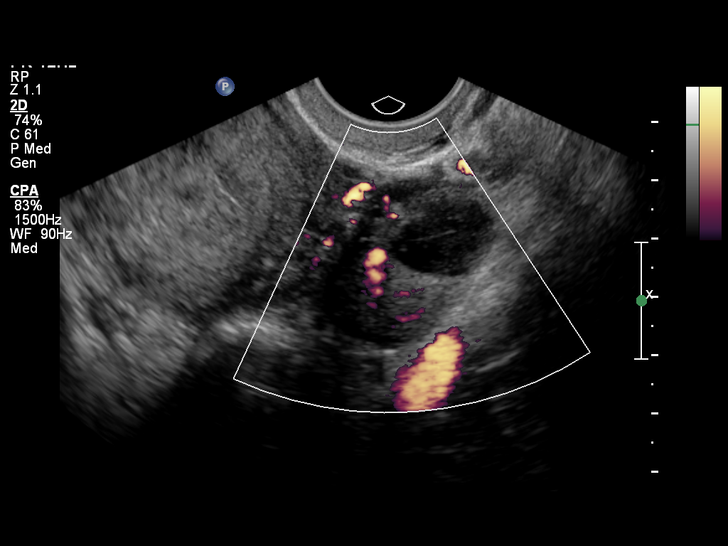
[im 43/51]
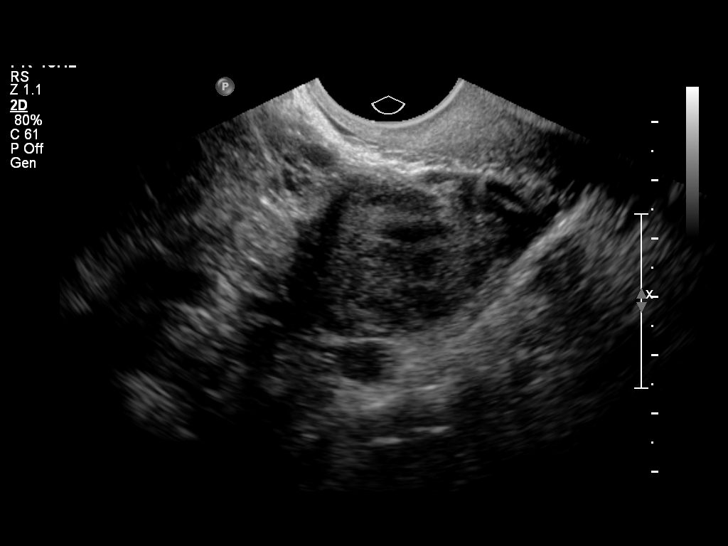
[im 47/51]
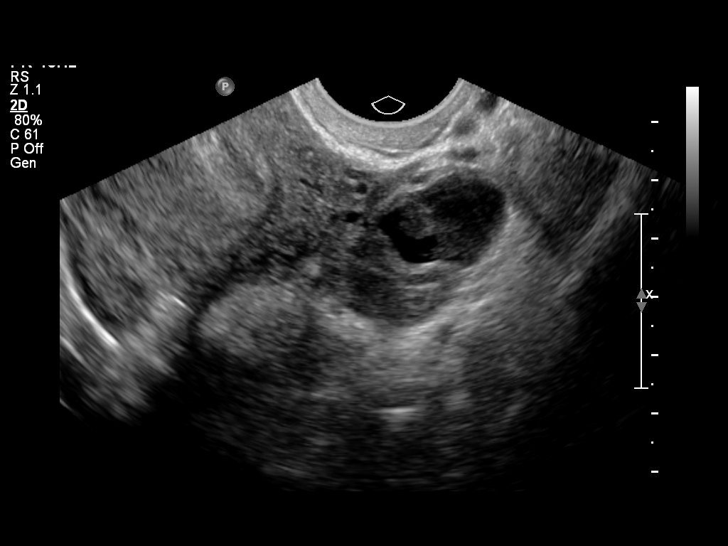
[im 51/51]
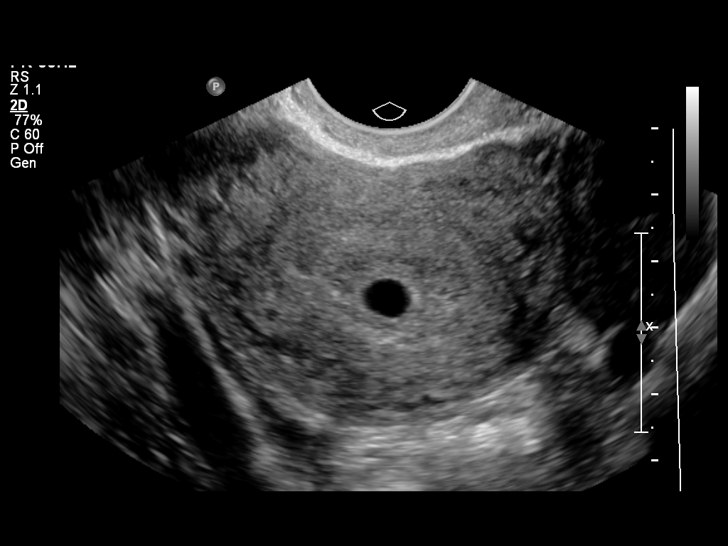

[14 of 28 positions shown; findings below may reference images not displayed]

Intrauterine gestational sac: Single
Yolk sac: None
Embryo: None
Cardiac Activity: None

MSD: 0.58 cm   5 w  1 d
            US EDC: 11/14/2012

Maternal uterus/Adnexae:
Normal right ovary.  Left ovary corpus luteum cyst.  No free fluid.

Compared to previous, a gestational sac was not definitely
visualized.
IMPRESSION: No IUP or adnexal mass identified.   DDx includes recent
SAB, IUP too early to visualize, or occult ectopic pregnancy.
Recommend close f/u of quantitative bHCG levels, and
followup US as clinically warranted.

## 2015-05-08 ENCOUNTER — Emergency Department (HOSPITAL_COMMUNITY)
Admission: EM | Admit: 2015-05-08 | Discharge: 2015-05-08 | Disposition: A | Discharge status: Home / Self Care | Payer: Medicaid Other | Attending: Emergency Medicine | Admitting: Emergency Medicine

## 2015-05-08 ENCOUNTER — Encounter (HOSPITAL_COMMUNITY): Payer: Self-pay | Admitting: *Deleted

## 2015-05-08 DIAGNOSIS — K0889 Other specified disorders of teeth and supporting structures: Secondary | ICD-10-CM | POA: Insufficient documentation

## 2015-05-08 DIAGNOSIS — K029 Dental caries, unspecified: Secondary | ICD-10-CM | POA: Insufficient documentation

## 2015-05-08 DIAGNOSIS — Z8751 Personal history of pre-term labor: Secondary | ICD-10-CM | POA: Insufficient documentation

## 2015-05-08 DIAGNOSIS — R51 Headache: Secondary | ICD-10-CM | POA: Insufficient documentation

## 2015-05-08 DIAGNOSIS — K002 Abnormalities of size and form of teeth: Secondary | ICD-10-CM | POA: Insufficient documentation

## 2015-05-08 MED ORDER — IBUPROFEN 800 MG PO TABS: 800.0000 mg | ORAL_TABLET | Freq: Three times a day (TID) | ORAL | Status: DC | PRN

## 2015-05-08 MED ORDER — ACETAMINOPHEN-CODEINE 120-12 MG/5ML PO SOLN: 10.0000 mL | ORAL | Status: DC | PRN

## 2015-05-08 MED ORDER — PENICILLIN V POTASSIUM 500 MG PO TABS: 500.0000 mg | ORAL_TABLET | Freq: Four times a day (QID) | ORAL | Status: DC

## 2015-05-08 MED ORDER — HYDROCODONE-ACETAMINOPHEN 5-325 MG PO TABS
1.0000 | ORAL_TABLET | Freq: Once | ORAL | Status: AC
Start: 2015-05-08 — End: 2015-05-08
  Administered 2015-05-08: 1 via ORAL
  Filled 2015-05-08: qty 1

## 2015-05-08 MED ORDER — IBUPROFEN 800 MG PO TABS
800.0000 mg | ORAL_TABLET | Freq: Once | ORAL | Status: AC
  Administered 2015-05-08: 800 mg via ORAL
  Filled 2015-05-08: qty 1

## 2015-05-08 NOTE — Discharge Instructions (Signed)
Return here as needed.  Follow-up with the dentist provided rinse with warm water, peroxide 3 times a day °

## 2015-05-08 NOTE — ED Provider Notes (Signed)
CSN: [id number]     Arrival date & time 05/08/15  2030 History  By signing my name below, I, Megan [name], attest that this documentation has been prepared under the direction and in the presence of Megan [name], [employer]F CorporationPA-C Electronically Signed: Soijett [name], ED Scribe. 05/08/2015. 9:17 PM.   Chief Complaint  Patient presents with  . Dental Pain      The history is provided by the patient. No language interpreter was used.    Megan OliphantJasmine T [name] is a 19 y.o. female who presents to the Emergency Department complaining of left upper dental pain onset 4-5 days worsening today. She states that she is having associated symptoms of HA. She states that she has tried OTC medications with no relief for her symptoms. She denies fever, chills, facial swelling, and any other symptoms. Pt denies allergies to any medications. Denies having a dentist at this time.    Past Medical History  Diagnosis Date  . Premature delivery   . Preterm labor    Past Surgical History  Procedure Laterality Date  . No past surgeries     Family History  Problem Relation Age of Onset  . Other Neg Hx    Social History  Substance Use Topics  . Smoking status: Never Smoker   . Smokeless tobacco: Never Used  . Alcohol Use: No   OB History    Gravida Para Term Preterm AB TAB SAB Ectopic Multiple Living   3 1  1 1  0 0 0  1     Review of Systems  A complete 10 system review of systems was obtained and all systems are negative except as noted in the HPI and PMH.   Allergies  Review of patient's allergies indicates no known allergies.  Home Medications   Prior to Admission medications   Not on File   BP 131/80 mmHg  Pulse 73  Temp(Src) 97.9 F (36.6 C) (Oral)  Resp 18  Ht 4\' 11"  (1.499 m)  Wt 172 lb (78.019 kg)  BMI 34.72 kg/m2  SpO2 100%  LMP 04/25/2015 Physical Exam  Constitutional: She is oriented to person, place, and time. She appears well-developed and well-nourished. No distress.  HENT:  Head:  Normocephalic and atraumatic.  Mouth/Throat: Abnormal dentition.  Tenderness along the gum line of the left upper teeth. Multiple areas of multiple decayed teeth.   Eyes: EOM are normal.  Neck: Neck supple.  Cardiovascular: Normal rate.   Pulmonary/Chest: Effort normal. No respiratory distress.  Musculoskeletal: Normal range of motion.  Neurological: She is alert and oriented to person, place, and time.  Skin: Skin is warm and dry.  Psychiatric: She has a normal mood and affect. Her behavior is normal.  Nursing note and vitals reviewed.   ED Course  Procedures (including critical care time) DIAGNOSTIC STUDIES: Oxygen Saturation is 100% on RA, nl by my interpretation.    COORDINATION OF CARE: 9:16 PM Discussed treatment plan with pt at bedside which includes abx Rx and pain medications and pt agreed to plan.   I personally performed the services described in this documentation, which was scribed in my presence. The recorded information has been reviewed and is accurate.    Charlestine NightChristopher Danijela Vessey, PA-C 05/11/15 0522  Arby BarretteMarcy Pfeiffer, MD 05/13/15 [phone]

## 2015-05-08 NOTE — ED Notes (Signed)
Pt states that she has left upper dental pain x 4-5 days; pt states that the pain has gotten worse today; pt states that she has been taking OTC medications with no relief

## 2016-10-31 ENCOUNTER — Encounter (HOSPITAL_COMMUNITY): Payer: Self-pay | Admitting: *Deleted

## 2016-10-31 ENCOUNTER — Inpatient Hospital Stay (HOSPITAL_COMMUNITY)
Admission: AD | Admit: 2016-10-31 | Discharge: 2016-10-31 | Discharge status: Against Medical Advice | Payer: 59 | Source: Ambulatory Visit | Attending: Obstetrics and Gynecology | Admitting: Obstetrics and Gynecology

## 2016-10-31 ENCOUNTER — Inpatient Hospital Stay (HOSPITAL_COMMUNITY): Payer: 59

## 2016-10-31 DIAGNOSIS — O26891 Other specified pregnancy related conditions, first trimester: Secondary | ICD-10-CM | POA: Insufficient documentation

## 2016-10-31 DIAGNOSIS — O209 Hemorrhage in early pregnancy, unspecified: Secondary | ICD-10-CM

## 2016-10-31 DIAGNOSIS — O208 Other hemorrhage in early pregnancy: Secondary | ICD-10-CM | POA: Diagnosis not present

## 2016-10-31 DIAGNOSIS — R1030 Lower abdominal pain, unspecified: Secondary | ICD-10-CM | POA: Diagnosis present

## 2016-10-31 DIAGNOSIS — R109 Unspecified abdominal pain: Secondary | ICD-10-CM

## 2016-10-31 DIAGNOSIS — Z3A01 Less than 8 weeks gestation of pregnancy: Secondary | ICD-10-CM | POA: Diagnosis not present

## 2016-10-31 LAB — CBC
HCT: 37.1 % (ref 36.0–46.0)
HEMOGLOBIN: 12.3 g/dL (ref 12.0–15.0)
MCH: 23.7 pg — ABNORMAL LOW (ref 26.0–34.0)
MCHC: 33.2 g/dL (ref 30.0–36.0)
MCV: 71.6 fL — ABNORMAL LOW (ref 78.0–100.0)
Platelets: 384 10*3/uL (ref 150–400)
RBC: 5.18 MIL/uL — ABNORMAL HIGH (ref 3.87–5.11)
RDW: 17.2 % — ABNORMAL HIGH (ref 11.5–15.5)
WBC: 8.6 10*3/uL (ref 4.0–10.5)

## 2016-10-31 LAB — URINALYSIS, ROUTINE W REFLEX MICROSCOPIC
BACTERIA UA: NONE SEEN
Bilirubin Urine: NEGATIVE
Glucose, UA: NEGATIVE mg/dL
Ketones, ur: 5 mg/dL — AB
NITRITE: NEGATIVE
PROTEIN: 30 mg/dL — AB
Specific Gravity, Urine: 1.034 — ABNORMAL HIGH (ref 1.005–1.030)
pH: 5 (ref 5.0–8.0)

## 2016-10-31 LAB — WET PREP, GENITAL
Sperm: NONE SEEN
TRICH WET PREP: NONE SEEN
Yeast Wet Prep HPF POC: NONE SEEN

## 2016-10-31 LAB — HCG, QUANTITATIVE, PREGNANCY: hCG, Beta Chain, Quant, S: 85419 m[IU]/mL — ABNORMAL HIGH (ref <5)

## 2016-10-31 LAB — POCT PREGNANCY, URINE: Preg Test, Ur: POSITIVE — AB

## 2016-10-31 NOTE — MAU Provider Note (Signed)
History     CSN: [id number]  Arrival date and time: 10/31/16 1710  First Provider Initiated Contact with Patient 10/31/16 2002      Chief Complaint  Patient presents with  . Possible Pregnancy  . Abdominal Pain  . Vaginal Bleeding   HPI Megan [name] is a 21 y.o. [id number] at 756w0d by LMP who presents with vaginal bleeding. Woke up this morning at 3 am with large amount of vaginal bleeding. Was also passing clots. States bleeding has slowed down in the last few hours and is now pink spotting. Had lower abdominal cramping & back pain that has improved throughout the day. Rates pain 8/10. Took ibuprofen this morning with mild relief. Denies n/v/d, constipation, dysuria, or vaginal discharge. No recent intercourse. Has not started prenatal care.  OB History    Gravida Para Term Preterm AB Living   3 1   1 1 1    SAB TAB Ectopic Multiple Live Births   0 0 0   1      Past Medical History:  Diagnosis Date  . Premature delivery   . Preterm labor     Past Surgical History:  Procedure Laterality Date  . NO PAST SURGERIES      Family History  Problem Relation Age of Onset  . Other Neg Hx     Social History  Substance Use Topics  . Smoking status: Never Smoker  . Smokeless tobacco: Never Used  . Alcohol use No    Allergies: No Known Allergies  Prescriptions Prior to Admission  Medication Sig Dispense Refill Last Dose  . ibuprofen (ADVIL,MOTRIN) 200 MG tablet Take 600 mg by mouth every 6 (six) hours as needed for moderate pain or cramping.   10/31/2016 at Unknown time    Review of Systems  Constitutional: Negative.   Gastrointestinal: Positive for abdominal pain and constipation. Negative for diarrhea, nausea and vomiting.  Genitourinary: Positive for vaginal bleeding. Negative for dysuria and vaginal discharge.  Musculoskeletal: Positive for back pain.   Physical Exam   Blood pressure 119/77, pulse 62, temperature 98.6 F (37 C), temperature source Oral, resp. rate  18, weight 175 lb 4 oz (79.5 kg), last menstrual period 08/22/2016, SpO2 100 %.  Physical Exam  Nursing note and vitals reviewed. Constitutional: She is oriented to person, place, and time. She appears well-developed and well-nourished. No distress.  HENT:  Head: Normocephalic and atraumatic.  Eyes: Conjunctivae are normal. Right eye exhibits no discharge. Left eye exhibits no discharge. No scleral icterus.  Neck: Normal range of motion.  Respiratory: Effort normal. No respiratory distress.  GI: Soft. She exhibits no distension. There is no tenderness.  Genitourinary: Uterus normal. Cervix exhibits no friability. There is bleeding (small amount of dark red blood; no active bleeding; cervix dilated 1 cm) in the vagina.  Neurological: She is alert and oriented to person, place, and time.  Skin: Skin is warm and dry. She is not diaphoretic.  Psychiatric: She has a normal mood and affect. Her behavior is normal. Judgment and thought content normal.    MAU Course  Procedures Results for orders placed or performed during the hospital encounter of 10/31/16 (from the past 24 hour(s))  Urinalysis, Routine w reflex microscopic     Status: Abnormal   Collection Time: 10/31/16  5:37 PM  Result Value Ref Range   Color, Urine AMBER (A) YELLOW   APPearance CLOUDY (A) CLEAR   Specific Gravity, Urine 1.034 (H) 1.005 - 1.030   pH 5.0 5.0 -  8.0   Glucose, UA NEGATIVE NEGATIVE mg/dL   Hgb urine dipstick LARGE (A) NEGATIVE   Bilirubin Urine NEGATIVE NEGATIVE   Ketones, ur 5 (A) NEGATIVE mg/dL   Protein, ur 30 (A) NEGATIVE mg/dL   Nitrite NEGATIVE NEGATIVE   Leukocytes, UA TRACE (A) NEGATIVE   RBC / HPF 0-5 0 - 5 RBC/hpf   WBC, UA 6-30 0 - 5 WBC/hpf   Bacteria, UA NONE SEEN NONE SEEN   Squamous Epithelial / LPF 6-30 (A) NONE SEEN   Mucous PRESENT   Pregnancy, urine POC     Status: Abnormal   Collection Time: 10/31/16  5:49 PM  Result Value Ref Range   Preg Test, Ur POSITIVE (A) NEGATIVE  CBC      Status: Abnormal   Collection Time: 10/31/16  6:46 PM  Result Value Ref Range   WBC 8.6 4.0 - 10.5 K/uL   RBC 5.18 (H) 3.87 - 5.11 MIL/uL   Hemoglobin 12.3 12.0 - 15.0 g/dL   HCT 82.9 56.2 - 13.0 %   MCV 71.6 (L) 78.0 - 100.0 fL   MCH 23.7 (L) 26.0 - 34.0 pg   MCHC 33.2 30.0 - 36.0 g/dL   RDW 86.5 (H) 78.4 - 69.6 %   Platelets 384 150 - 400 K/uL  hCG, quantitative, pregnancy     Status: Abnormal   Collection Time: 10/31/16  6:46 PM  Result Value Ref Range   hCG, Beta Chain, Quant, S 85,419 (H) <5 mIU/mL    MDM +UPT UA, wet prep, GC/chlamydia, CBC, ABO/Rh, quant hCG, HIV, and [place] today to rule out ectopic pregnancy AB positive Pt in ultrasound. Care turned over to [facility]    Judeth Horn, NP 10/31/2016 9:32 PM   Assessment and Plan

## 2016-10-31 NOTE — MAU Note (Signed)
Took a preg test on May 3, was +.  Last night woke up at 0200- was cramping and having back pain, had started bleeding and is passing clumps. Not sure if she is having a miscarriage or what.  Had not been seen or had the preg confirmed.

## 2016-10-31 NOTE — MAU Note (Signed)
Pt had to leave due to child care issues. Pt signed AMA form. Pt will call back later for u/s results.

## 2016-11-01 ENCOUNTER — Telehealth: Payer: Self-pay | Admitting: Advanced Practice Midwife

## 2016-11-01 LAB — HIV ANTIBODY (ROUTINE TESTING W REFLEX): HIV SCREEN 4TH GENERATION: NONREACTIVE

## 2016-11-01 NOTE — Telephone Encounter (Signed)
Pt left AMA from her visit last night before she was seen by provider.  She called this morning for her ultrasound results.  I presented results of IUP at 1723w5d with moderate subchorionic hemorrhage with pt.  Questions answered. Pt to start prenatal care with provider of her choice.  Pt also reports constipation so reviewed high fiber diet, increase in fluids, and safe OTC medications with pt.  Pt states understanding.

## 2016-11-03 LAB — GC/CHLAMYDIA PROBE AMP (~~LOC~~) NOT AT ARMC
CHLAMYDIA, DNA PROBE: NEGATIVE
Neisseria Gonorrhea: NEGATIVE

## 2016-11-12 ENCOUNTER — Inpatient Hospital Stay (HOSPITAL_COMMUNITY)
Admission: AD | Admit: 2016-11-12 | Discharge: 2016-11-12 | Disposition: A | Discharge status: Home / Self Care | Payer: 59 | Source: Ambulatory Visit | Attending: Obstetrics and Gynecology | Admitting: Obstetrics and Gynecology

## 2016-11-12 ENCOUNTER — Encounter (HOSPITAL_COMMUNITY): Payer: Self-pay | Admitting: *Deleted

## 2016-11-12 DIAGNOSIS — N898 Other specified noninflammatory disorders of vagina: Secondary | ICD-10-CM | POA: Insufficient documentation

## 2016-11-12 DIAGNOSIS — O26891 Other specified pregnancy related conditions, first trimester: Secondary | ICD-10-CM | POA: Diagnosis not present

## 2016-11-12 DIAGNOSIS — R102 Pelvic and perineal pain: Secondary | ICD-10-CM | POA: Diagnosis not present

## 2016-11-12 DIAGNOSIS — O208 Other hemorrhage in early pregnancy: Secondary | ICD-10-CM | POA: Diagnosis not present

## 2016-11-12 DIAGNOSIS — Z79899 Other long term (current) drug therapy: Secondary | ICD-10-CM | POA: Diagnosis not present

## 2016-11-12 DIAGNOSIS — O209 Hemorrhage in early pregnancy, unspecified: Secondary | ICD-10-CM

## 2016-11-12 LAB — URINALYSIS, ROUTINE W REFLEX MICROSCOPIC
BILIRUBIN URINE: NEGATIVE
Bacteria, UA: NONE SEEN
Glucose, UA: NEGATIVE mg/dL
Ketones, ur: NEGATIVE mg/dL
NITRITE: NEGATIVE
Protein, ur: NEGATIVE mg/dL
Specific Gravity, Urine: 1.038 — ABNORMAL HIGH (ref 1.005–1.030)
pH: 5 (ref 5.0–8.0)

## 2016-11-12 NOTE — Discharge Instructions (Signed)
Vaginal Bleeding During Pregnancy, First Trimester A small amount of bleeding (spotting) from the vagina is common in early pregnancy. Sometimes the bleeding is normal and is not a problem, and sometimes it is a sign of something serious. Be sure to tell your doctor about any bleeding from your vagina right away. Follow these instructions at home:  Watch your condition for any changes.  Follow your doctor's instructions about how active you can be.  If you are on bed rest: ? You may need to stay in bed and only get up to use the bathroom. ? You may be allowed to do some activities. ? If you need help, make plans for someone to help you.  Write down: ? The number of pads you use each day. ? How often you change pads. ? How soaked (saturated) your pads are.  Do not use tampons.  Do not douche.  Do not have sex or orgasms until your doctor says it is okay.  If you pass any tissue from your vagina, save the tissue so you can show it to your doctor.  Only take medicines as told by your doctor.  Do not take aspirin because it can make you bleed.  Keep all follow-up visits as told by your doctor. Contact a doctor if:  You bleed from your vagina.  You have cramps.  You have labor pains.  You have a fever that does not go away after you take medicine. Get help right away if:  You have very bad cramps in your back or belly (abdomen).  You pass large clots or tissue from your vagina.  You bleed more.  You feel light-headed or weak.  You pass out (faint).  You have chills.  You are leaking fluid or have a gush of fluid from your vagina.  You pass out while pooping (having a bowel movement). This information is not intended to replace advice given to you by your health care provider. Make sure you discuss any questions you have with your health care provider. Document Released: 09/19/2013 Document Revised: 10/11/2015 Document Reviewed: 01/10/2013 Elsevier Interactive  Patient Education  2018 [employer]. Risk analyst Patient Education  [employer]. First Trimester of Pregnancy The first trimester of pregnancy is from week 1 until the end of week 13 (months 1 through 3). A week after a sperm fertilizes an egg, the egg will implant on the wall of the uterus. This embryo will begin to develop into a baby. Genes from you and your partner will form the baby. The female genes will determine whether the baby will be a boy or a girl. At 6-8 weeks, the eyes and face will be formed, and the heartbeat can be seen on ultrasound. At the end of 12 weeks, all the baby's organs will be formed. Now that you are pregnant, you will want to do everything you can to have a healthy baby. Two of the most important things are to get good prenatal care and to follow your health care provider's instructions. Prenatal care is all the medical care you receive before the baby's birth. This care will help prevent, find, and treat any problems during the pregnancy and childbirth. Body changes during your first trimester Your body goes through many changes during pregnancy. The changes vary from woman to woman.  You may gain or lose a couple of pounds at first.  You may feel sick to your stomach (nauseous) and you may throw up (vomit). If the vomiting is  uncontrollable, call your health care provider.  You may tire easily.  You may develop headaches that can be relieved by medicines. All medicines should be approved by your health care provider.  You may urinate more often. Painful urination may mean you have a bladder infection.  You may develop heartburn as a result of your pregnancy.  You may develop constipation because certain hormones are causing the muscles that push stool through your intestines to slow down.  You may develop hemorrhoids or swollen veins (varicose veins).  Your breasts may begin to grow larger and become tender. Your nipples may stick out more, and  the tissue that surrounds them (areola) may become darker.  Your gums may bleed and may be sensitive to brushing and flossing.  Dark spots or blotches (chloasma, mask of pregnancy) may develop on your face. This will likely fade after the baby is born.  Your menstrual periods will stop.  You may have a loss of appetite.  You may develop cravings for certain kinds of food.  You may have changes in your emotions from day to day, such as being excited to be pregnant or being concerned that something may go wrong with the pregnancy and baby.  You may have more vivid and strange dreams.  You may have changes in your hair. These can include thickening of your hair, rapid growth, and changes in texture. Some women also have hair loss during or after pregnancy, or hair that feels dry or thin. Your hair will most likely return to normal after your baby is born.  What to expect at prenatal visits During a routine prenatal visit:  You will be weighed to make sure you and the baby are growing normally.  Your blood pressure will be taken.  Your abdomen will be measured to track your baby's growth.  The fetal heartbeat will be listened to between weeks 10 and 14 of your pregnancy.  Test results from any previous visits will be discussed.  Your health care provider may ask you:  How you are feeling.  If you are feeling the baby move.  If you have had any abnormal symptoms, such as leaking fluid, bleeding, severe headaches, or abdominal cramping.  If you are using any tobacco products, including cigarettes, chewing tobacco, and electronic cigarettes.  If you have any questions.  Other tests that may be performed during your first trimester include:  Blood tests to find your blood type and to check for the presence of any previous infections. The tests will also be used to check for low iron levels (anemia) and protein on red blood cells (Rh antibodies). Depending on your risk factors, or  if you previously had diabetes during pregnancy, you may have tests to check for high blood sugar that affects pregnant women (gestational diabetes).  Urine tests to check for infections, diabetes, or protein in the urine.  An ultrasound to confirm the proper growth and development of the baby.  Fetal screens for spinal cord problems (spina bifida) and Down syndrome.  HIV (human immunodeficiency virus) testing. Routine prenatal testing includes screening for HIV, unless you choose not to have this test.  You may need other tests to make sure you and the baby are doing well.  Follow these instructions at home: Medicines  Follow your health care provider's instructions regarding medicine use. Specific medicines may be either safe or unsafe to take during pregnancy.  Take a prenatal vitamin that contains at least 600 micrograms (mcg) of folic acid.  If you develop constipation, try taking a stool softener if your health care provider approves. Eating and drinking  Eat a balanced diet that includes fresh fruits and vegetables, whole grains, good sources of protein such as meat, eggs, or tofu, and low-fat dairy. Your health care provider will help you determine the amount of weight gain that is right for you.  Avoid raw meat and uncooked cheese. These carry germs that can cause birth defects in the baby.  Eating four or five small meals rather than three large meals a day may help relieve nausea and vomiting. If you start to feel nauseous, eating a few soda crackers can be helpful. Drinking liquids between meals, instead of during meals, also seems to help ease nausea and vomiting.  Limit foods that are high in fat and processed sugars, such as fried and sweet foods.  To prevent constipation: ? Eat foods that are high in fiber, such as fresh fruits and vegetables, whole grains, and beans. ? Drink enough fluid to keep your urine clear or pale yellow. Activity  Exercise only as directed  by your health care provider. Most women can continue their usual exercise routine during pregnancy. Try to exercise for 30 minutes at least 5 days a week. Exercising will help you: ? Control your weight. ? Stay in shape. ? Be prepared for labor and delivery.  Experiencing pain or cramping in the lower abdomen or lower back is a good sign that you should stop exercising. Check with your health care provider before continuing with normal exercises.  Try to avoid standing for long periods of time. Move your legs often if you must stand in one place for a long time.  Avoid heavy lifting.  Wear low-heeled shoes and practice good posture.  You may continue to have sex unless your health care provider tells you not to. Relieving pain and discomfort  Wear a good support bra to relieve breast tenderness.  Take warm sitz baths to soothe any pain or discomfort caused by hemorrhoids. Use hemorrhoid cream if your health care provider approves.  Rest with your legs elevated if you have leg cramps or low back pain.  If you develop varicose veins in your legs, wear support hose. Elevate your feet for 15 minutes, 3-4 times a day. Limit salt in your diet. Prenatal care  Schedule your prenatal visits by the twelfth week of pregnancy. They are usually scheduled monthly at first, then more often in the last 2 months before delivery.  Write down your questions. Take them to your prenatal visits.  Keep all your prenatal visits as told by your health care provider. This is important. Safety  Wear your seat belt at all times when driving.  Make a list of emergency phone numbers, including numbers for family, friends, the hospital, and police and fire departments. General instructions  Ask your health care provider for a referral to a local prenatal education class. Begin classes no later than the beginning of month 6 of your pregnancy.  Ask for help if you have counseling or nutritional needs during  pregnancy. Your health care provider can offer advice or refer you to specialists for help with various needs.  Do not use hot tubs, steam rooms, or saunas.  Do not douche or use tampons or scented sanitary pads.  Do not cross your legs for long periods of time.  Avoid cat litter boxes and soil used by cats. These carry germs that can cause birth defects in the baby  and possibly loss of the fetus by miscarriage or stillbirth.  Avoid all smoking, herbs, alcohol, and medicines not prescribed by your health care provider. Chemicals in these products affect the formation and growth of the baby.  Do not use any products that contain nicotine or tobacco, such as cigarettes and e-cigarettes. If you need help quitting, ask your health care provider. You may receive counseling support and other resources to help you quit.  Schedule a dentist appointment. At home, brush your teeth with a soft toothbrush and be gentle when you floss. Contact a health care provider if:  You have dizziness.  You have mild pelvic cramps, pelvic pressure, or nagging pain in the abdominal area.  You have persistent nausea, vomiting, or diarrhea.  You have a bad smelling vaginal discharge.  You have pain when you urinate.  You notice increased swelling in your face, hands, legs, or ankles.  You are exposed to fifth disease or chickenpox.  You are exposed to [place] measles (rubella) and have never had it. Get help right away if:  You have a fever.  You are leaking fluid from your vagina.  You have spotting or bleeding from your vagina.  You have severe abdominal cramping or pain.  You have rapid weight gain or loss.  You vomit blood or material that looks like coffee grounds.  You develop a severe headache.  You have shortness of breath.  You have any kind of trauma, such as from a fall or a car accident. Summary  The first trimester of pregnancy is from week 1 until the end of week 13 (months 1  through 3).  Your body goes through many changes during pregnancy. The changes vary from woman to woman.  You will have routine prenatal visits. During those visits, your health care provider will examine you, discuss any test results you may have, and talk with you about how you are feeling. This information is not intended to replace advice given to you by your health care provider. Make sure you discuss any questions you have with your health care provider. Document Released: 04/29/2001 Document Revised: 04/16/2016 Document Reviewed: 04/16/2016 Elsevier Interactive Patient Education  2017 [employer].

## 2016-11-12 NOTE — MAU Note (Signed)
+   lower right abdominal pain +pelvic pain Rating pain 8/10 Has tried tylenol at 10am ; states helped a little  Still having vaginal bleeding Passing dime size clots

## 2016-11-12 NOTE — MAU Provider Note (Signed)
Chief Complaint: Abdominal Pain; Vaginal Bleeding; and Pelvic Pain   First Provider Initiated Contact with Patient 11/12/16 1816        SUBJECTIVE HPI: Megan [name] is a 21 y.o. [id number] at [redacted]w[redacted]d by LMP who presents to maternity admissions reporting lower abdominal pain and bleeding which have persisted since 10/31/16  She had an [place] on that day which confirmed live single IUP and a moderate subchorionic hemorrhage. She denies vaginal itching/burning, urinary symptoms, h/a, dizziness, n/v, or fever/chills.     Abdominal Pain  This is a recurrent problem. The current episode started 1 to 4 weeks ago. The onset quality is gradual. The problem occurs intermittently. The problem has been unchanged. The pain is located in the LLQ and RLQ. The pain is moderate. The quality of the pain is cramping. The abdominal pain does not radiate. Pertinent negatives include no constipation, diarrhea, fever, headaches, nausea or vomiting. The pain is aggravated by palpation. The pain is relieved by nothing.  Vaginal Bleeding  The patient's primary symptoms include pelvic pain and vaginal bleeding. The patient's pertinent negatives include no genital itching, genital lesions or genital odor. This is a recurrent problem. The current episode started 1 to 4 weeks ago. The problem occurs constantly. The pain is moderate. The problem affects the right side. Associated symptoms include abdominal pain. Pertinent negatives include no constipation, diarrhea, fever, headaches, nausea or vomiting. The vaginal discharge was bloody. The vaginal bleeding is lighter than menses. She has not been passing clots. She has not been passing tissue. Nothing aggravates the symptoms. She has tried nothing for the symptoms.  Pelvic Pain  The patient's primary symptoms include pelvic pain and vaginal bleeding. The patient's pertinent negatives include no genital itching, genital lesions or genital odor. Associated symptoms include abdominal pain.  Pertinent negatives include no constipation, diarrhea, fever, headaches, nausea or vomiting.    RN note: + lower right abdominal pain +pelvic pain Rating pain 8/10 Has tried tylenol at 10am ; states helped a little Still having vaginal bleeding Passing dime size clots     Electronically signed by Randa Evens     Past Medical History:  Diagnosis Date  . Premature delivery   . Preterm labor    Past Surgical History:  Procedure Laterality Date  . NO PAST SURGERIES     Social History   Social History  . Marital status: Single    Spouse name: N/A  . Number of children: N/A  . Years of education: N/A   Occupational History  . Not on file.   Social History Main Topics  . Smoking status: Never Smoker  . Smokeless tobacco: Never Used  . Alcohol use No  . Drug use: No  . Sexual activity: Yes    Birth control/ protection: None   Other Topics Concern  . Not on file   Social History Narrative  . No narrative on file   No current facility-administered medications on file prior to encounter.    Current Outpatient Prescriptions on File Prior to Encounter  Medication Sig Dispense Refill  . ibuprofen (ADVIL,MOTRIN) 200 MG tablet Take 600 mg by mouth every 6 (six) hours as needed for moderate pain or cramping.     No Known Allergies  I have reviewed patient's Past Medical Hx, Surgical Hx, Family Hx, Social Hx, medications and allergies.   ROS:  Review of Systems  Constitutional: Negative for fever.  Gastrointestinal: Positive for abdominal pain. Negative for constipation, diarrhea, nausea and vomiting.  Genitourinary: Positive  for pelvic pain and vaginal bleeding.  Neurological: Negative for headaches.   Review of Systems  Other systems negative   Physical Exam  Physical Exam Patient Vitals for the past 24 hrs:  BP Temp Temp src Pulse Resp SpO2 Weight  11/12/16 1714 116/72 98.2 F (36.8 C) Oral 67 18 100 % 178 lb 1.9 oz (80.8 kg)   Constitutional:  Well-developed, well-nourished female in no acute distress.  Cardiovascular: normal rate Respiratory: normal effort GI: Abd soft, non-tender. Pos BS x 4 MS: Extremities nontender, no edema, normal ROM Neurologic: Alert and oriented x 4.  GU: Neg CVAT.  PELVIC EXAM: Small amount of vaginal bleeding noted. No active hemorrhage  Bedside US done which reveals a fetal pole with + cardiac activity of 160bpm  LAB RESULTS Results for orders placed or performed during the hospital encounter of 11/12/16 (from the past 24 hour(s))  Urinalysis, Routine w reflex microscopic     Status: Abnormal   Collection Time: 11/12/16  5:10 PM  Result Value Ref Range   Color, Urine YELLOW YELLOW   APPearance HAZY (A) CLEAR   Specific Gravity, Urine 1.038 (H) 1.005 - 1.030   pH 5.0 5.0 - 8.0   Glucose, UA NEGATIVE NEGATIVE mg/dL   Hgb urine dipstick LARGE (A) NEGATIVE   Bilirubin Urine NEGATIVE NEGATIVE   Ketones, ur NEGATIVE NEGATIVE mg/dL   Protein, ur NEGATIVE NEGATIVE mg/dL   Nitrite NEGATIVE NEGATIVE   Leukocytes, UA SMALL (A) NEGATIVE   RBC / HPF 0-5 0 - 5 RBC/hpf   WBC, UA 6-30 0 - 5 WBC/hpf   Bacteria, UA NONE SEEN NONE SEEN   Squamous Epithelial / LPF 6-30 (A) NONE SEEN   Mucous PRESENT        IMAGING [place] Ob Comp Less 14 Wks  Result Date: 10/31/2016 CLINICAL DATA:  Vaginal bleeding and cramping EXAM: OBSTETRIC <14 WK US AND TRANSVAGINAL OB US TECHNIQUE: Both transabdominal and transvaginal ultrasound examinations were performed for complete evaluation of the gestation as well as the maternal uterus, adnexal regions, and pelvic cul-de-sac. Transvaginal technique was performed to assess early pregnancy. COMPARISON:  None. FINDINGS: Intrauterine gestational sac: Single intrauterine gestation Yolk sac:  Visualized Embryo:  Visualized Cardiac Activity: Visualized Heart Rate: 137  bpm CRL:  8.7  mm   6 w   5 d                  US EDC: 06/21/2017 Subchorionic hemorrhage:  Moderate subchorionic  hemorrhage. Maternal uterus/adnexae: Ovaries are within normal limits. The right ovary measures 2.8 by 3 x 2 cm. The left ovary measures 1.7 x 2.3 x 1.4 cm. Trace free fluid in the pelvis. IMPRESSION: 1. Single viable intrauterine pregnancy as above. There is a moderate subchorionic hemorrhage. 2. Trace free fluid in the pelvis Electronically Signed   By: Kripa PangKim  Fujinaga M.D.   On: 10/31/2016 21:45   [place] Ob Transvaginal  Result Date: 10/31/2016 CLINICAL DATA:  Vaginal bleeding and cramping EXAM: OBSTETRIC <14 WK US AND TRANSVAGINAL OB US TECHNIQUE: Both transabdominal and transvaginal ultrasound examinations were performed for complete evaluation of the gestation as well as the maternal uterus, adnexal regions, and pelvic cul-de-sac. Transvaginal technique was performed to assess early pregnancy. COMPARISON:  None. FINDINGS: Intrauterine gestational sac: Single intrauterine gestation Yolk sac:  Visualized Embryo:  Visualized Cardiac Activity: Visualized Heart Rate: 137  bpm CRL:  8.7  mm   6 w   5 d  [place] EDC: 06/21/2017 Subchorionic hemorrhage:  Moderate subchorionic hemorrhage. Maternal uterus/adnexae: Ovaries are within normal limits. The right ovary measures 2.8 by 3 x 2 cm. The left ovary measures 1.7 x 2.3 x 1.4 cm. Trace free fluid in the pelvis. IMPRESSION: 1. Single viable intrauterine pregnancy as above. There is a moderate subchorionic hemorrhage. 2. Trace free fluid in the pelvis Electronically Signed   By: Bryanne Pang M.D.   On: 10/31/2016 21:45    MAU Management/MDM: Reassured her the baby is doing well Moderate [facility] may produce bleeding for a few more weeks Will have her followup with prenatal care  ASSESSMENT .Single IUP at [redacted]w[redacted]d Bleeding in first trimester Pelvic pain in early pregnancy  PLAN Discharge home Pelvic rest Followup with prenatal care Pt stable at time of discharge. Encouraged to return here or to other Urgent Care/ED if she develops worsening of  symptoms, increase in pain, fever, or other concerning symptoms.    Wynelle Bourgeois CNM, MSN Certified Nurse-Midwife 11/12/2016  6:16 PM

## 2017-05-14 ENCOUNTER — Inpatient Hospital Stay (HOSPITAL_COMMUNITY)
Admission: AD | Admit: 2017-05-14 | Discharge: 2017-05-15 | Discharge status: Against Medical Advice | Payer: 59 | Source: Ambulatory Visit | Attending: Family Medicine | Admitting: Family Medicine

## 2017-05-14 ENCOUNTER — Encounter (HOSPITAL_COMMUNITY): Payer: Self-pay | Admitting: *Deleted

## 2017-05-14 DIAGNOSIS — O26891 Other specified pregnancy related conditions, first trimester: Secondary | ICD-10-CM | POA: Diagnosis not present

## 2017-05-14 DIAGNOSIS — Z3A12 12 weeks gestation of pregnancy: Secondary | ICD-10-CM | POA: Insufficient documentation

## 2017-05-14 DIAGNOSIS — R109 Unspecified abdominal pain: Secondary | ICD-10-CM | POA: Diagnosis not present

## 2017-05-14 LAB — URINALYSIS, ROUTINE W REFLEX MICROSCOPIC
BILIRUBIN URINE: NEGATIVE
Glucose, UA: NEGATIVE mg/dL
Hgb urine dipstick: NEGATIVE
KETONES UR: NEGATIVE mg/dL
Nitrite: NEGATIVE
PH: 5 (ref 5.0–8.0)
Protein, ur: NEGATIVE mg/dL
Specific Gravity, Urine: 1.031 — ABNORMAL HIGH (ref 1.005–1.030)

## 2017-05-14 LAB — POCT PREGNANCY, URINE: Preg Test, Ur: POSITIVE — AB

## 2017-05-14 NOTE — MAU Note (Signed)
PT SAYS LMP WAS 7-7 - HAS IRREG   CYCLES.  LAST MTH   BLED HEAVY .   DID HPT YESTERDAY - POSITIVE.   NO BIRTH  CONTROL. LAST SEX-  LAST MTH.    NO BLEEDING  NOW.  CRAMPS  BAD YESTERDAY. - TOOK TYLENOL-  2 TABS  PLAIN

## 2017-05-15 ENCOUNTER — Inpatient Hospital Stay (HOSPITAL_COMMUNITY): Payer: 59

## 2017-05-15 ENCOUNTER — Encounter (HOSPITAL_COMMUNITY): Payer: Self-pay | Admitting: *Deleted

## 2017-05-15 ENCOUNTER — Inpatient Hospital Stay (HOSPITAL_COMMUNITY)
Admission: AD | Admit: 2017-05-15 | Discharge: 2017-05-15 | Disposition: A | Discharge status: Home / Self Care | Payer: 59 | Source: Ambulatory Visit | Attending: Obstetrics & Gynecology | Admitting: Obstetrics & Gynecology

## 2017-05-15 DIAGNOSIS — Z79899 Other long term (current) drug therapy: Secondary | ICD-10-CM | POA: Insufficient documentation

## 2017-05-15 DIAGNOSIS — R102 Pelvic and perineal pain: Secondary | ICD-10-CM | POA: Insufficient documentation

## 2017-05-15 DIAGNOSIS — O26891 Other specified pregnancy related conditions, first trimester: Secondary | ICD-10-CM | POA: Diagnosis not present

## 2017-05-15 DIAGNOSIS — N949 Unspecified condition associated with female genital organs and menstrual cycle: Secondary | ICD-10-CM

## 2017-05-15 DIAGNOSIS — Z3490 Encounter for supervision of normal pregnancy, unspecified, unspecified trimester: Secondary | ICD-10-CM

## 2017-05-15 DIAGNOSIS — Z3A12 12 weeks gestation of pregnancy: Secondary | ICD-10-CM | POA: Diagnosis not present

## 2017-05-15 DIAGNOSIS — R109 Unspecified abdominal pain: Secondary | ICD-10-CM | POA: Diagnosis not present

## 2017-05-15 LAB — CBC
HCT: 36.2 % (ref 36.0–46.0)
Hemoglobin: 12.1 g/dL (ref 12.0–15.0)
MCH: 24.2 pg — ABNORMAL LOW (ref 26.0–34.0)
MCHC: 33.4 g/dL (ref 30.0–36.0)
MCV: 72.5 fL — AB (ref 78.0–100.0)
PLATELETS: 356 10*3/uL (ref 150–400)
RBC: 4.99 MIL/uL (ref 3.87–5.11)
RDW: 17.6 % — AB (ref 11.5–15.5)
WBC: 10.3 10*3/uL (ref 4.0–10.5)

## 2017-05-15 LAB — WET PREP, GENITAL
Sperm: NONE SEEN
TRICH WET PREP: NONE SEEN
Yeast Wet Prep HPF POC: NONE SEEN

## 2017-05-15 LAB — HCG, QUANTITATIVE, PREGNANCY: hCG, Beta Chain, Quant, S: 51038 m[IU]/mL — ABNORMAL HIGH (ref <5)

## 2017-05-15 NOTE — Progress Notes (Signed)
Heather Hogan CNM in earlier to discuss test results and d/c plan. Written and verbal d/c instructions given and understanding voiced 

## 2017-05-15 NOTE — Discharge Instructions (Signed)
Prenatal Care Providers °[facility]    [facility] ° Phone- [phone]     Phone: [phone] °         °[facility]                      [facility] ° @[place]     Phone: [phone] ° Phone: [phone] °        [facility] °[facility]     Phone: [phone] ° Phone: [phone]   °        [facility] °[facility] [place]                hone: [phone] ° Phone: [phone] °        [facility] °Dr. Bernard Marshall      Phone: [phone] ° Phone: [phone] °        [facility] °[facility]                Phone: [phone] ° Women’s Health  ° Phone:[phone]    [facility] °         Phone: [phone] °[facility] °  Phone: [phone] ° °Safe Medications in Pregnancy  ° °Acne: °Benzoyl Peroxide °Salicylic Acid ° °Backache/Headache: °Tylenol: 2 regular strength every 4 hours OR °             2 Extra strength every 6 hours ° °Colds/Coughs/Allergies: °Benadryl (alcohol free) 25 mg every 6 hours as needed °Breath right strips °Claritin °Cepacol throat lozenges °Chloraseptic throat spray °Cold-Eeze- up to three times per day °Cough drops, alcohol free °Flonase (by prescription only) °Guaifenesin °Mucinex °Robitussin DM (plain only, alcohol free) °Saline nasal spray/drops °Sudafed (pseudoephedrine) & Actifed ** use only after [redacted] weeks gestation and if you do not have high blood pressure °Tylenol °Vicks Vaporub °Zinc lozenges °Zyrtec  ° °Constipation: °Colace °Ducolax suppositories °Fleet enema °Glycerin suppositories °Metamucil °Milk of magnesia °Miralax °Senokot °Smooth move tea ° °Diarrhea: °Kaopectate °Imodium A-D ° °*NO pepto Bismol ° °Hemorrhoids: °Anusol °Anusol HC °Preparation H °Tucks ° °Indigestion: °Tums °Maalox °Mylanta °Zantac  °Pepcid ° °Insomnia: °Benadryl (alcohol free) 25mg every 6 hours as needed °Tylenol  PM °Unisom, no Gelcaps ° °Leg Cramps: °Tums °MagGel ° °Nausea/Vomiting:  °Bonine °Dramamine °Emetrol °Ginger extract °Sea bands °Meclizine  °Nausea medication to take during pregnancy:  °Unisom (doxylamine succinate 25 mg tablets) Take one tablet daily at bedtime. If symptoms are not adequately controlled, the dose can be increased to a maximum recommended dose of two tablets daily (1/2 tablet in the morning, 1/2 tablet mid-afternoon and one at bedtime). °Vitamin B6 100mg tablets. Take one tablet twice a day (up to 200 mg per day). ° °Skin Rashes: °Aveeno products °Benadryl cream or 25mg every 6 hours as needed °Calamine Lotion °1% cortisone cream ° °Yeast infection: °Gyne-lotrimin 7 °Monistat 7 ° ° °**If taking multiple medications, please check labels to avoid duplicating the same active ingredients °**take medication as directed on the label °** Do not exceed 4000 mg of tylenol in 24 hours °**Do not take medications that contain aspirin or ibuprofen ° °Round Ligament Pain during Pregnancy °Many women will experience a type of pain referred to as “round ligament pain” during their °pregnancy. This is associated with abdominal pain or discomfort. Since any type of abdominal °pain during pregnancy can be disconcerting, it is important   to talk about round ligament pain to °relieve any anxiety or fears you may have regarding the symptoms you are feeling. Round °ligament pain is due to normal changes that take place in the body during pregnancy. It is caused °by stretching of the round ligaments attached to the uterus. More commonly it occurs on the right °side of the pelvis. °Round Ligament: An Overview °Typically in the non-pregnant state the uterus is about the size of an apple or pear. There are °thick ligaments which hold the uterus in place in the abdomen, referred to as round ligaments. °During pregnancy, your uterus will expand in size and weight, and the ligaments supporting it will °have to stretch, becoming  longer and thinner. As these ligaments pull and tug they may irritate °nearby nerve fibers, which causes pain. The severity of the pain in some cases can seem °extreme. °Some common symptoms of round ligament pain include: °• Ligament spasms or contractions/cramps that trigger a sharp pain typically on the right °side of the abdomen. °• Pain upon waking or suddenly rolling over in your sleep. °• Pain in the abdomen that is sharp brought on by exercise or other vigorous activity. °Similar Problems °Round ligament pain is often mistaken for other medical conditions because the symptoms are °similar. Acute abdominal pain during pregnancy may also be a sign of other conditions including: °• Abdominal cramps - Some abdominal pain is simply caused by change in bowel habits °associated with pregnancy. Gas is a common problem that can cause sharp, shooting °pain. °You should always seek out medical care if your pain is accompanied by fever, chills, pain upon °urination or if you have difficulty walking. Further exams and tests will be conducted to ensure °that you do not have a more serious condition. It is not uncommon for women with lower °abdominal pain to have a urinary tract infection, thus you may also be asked for a urine sample. °Treatment °If all other conditions are ruled out you can treat your round ligament pain relatively easily. You °may be advised to take some acetaminophen (Tylenol) to reduce the severity of any persistent °pain and asked to reduce your activity level. You can apply a heating pad to the area of pain or °take a warm bath. Lying on the opposite side of the pain may help as well. °Most women will find relief from round ligament pain simply by altering their daily routines slightly. °The good news is round ligament pain will disappear completely once you have given birth to °your child! ° °

## 2017-05-15 NOTE — MAU Provider Note (Signed)
History     CSN: [id number]  Arrival date and time: 05/15/17 1702   First Provider Initiated Contact with Patient 05/15/17 2017      Chief Complaint  Patient presents with  . Pelvic Pain   Pelvic Pain  The patient's primary symptoms include pelvic pain. The patient's pertinent negatives include no vaginal discharge. This is a new problem. The current episode started yesterday. The problem occurs constantly. The problem has been gradually worsening. Pain severity now: 8/10. The problem affects the right side. She is pregnant. Associated symptoms include nausea. Pertinent negatives include no chills, dysuria, fever, frequency or vomiting. The vaginal discharge was normal. There has been no bleeding. The symptoms are aggravated by activity. She has tried acetaminophen for the symptoms. The treatment provided no relief. Sexual activity: no intercourse in the last 24 hours.  She uses nothing for contraception. Her menstrual history has been irregular (LMP10/10/18).   Past Medical History:  Diagnosis Date  . Premature delivery   . Preterm labor     Past Surgical History:  Procedure Laterality Date  . INDUCED ABORTION    . NO PAST SURGERIES      Family History  Problem Relation Age of Onset  . Other Neg Hx     Social History   Tobacco Use  . Smoking status: Never Smoker  . Smokeless tobacco: Never Used  Substance Use Topics  . Alcohol use: No  . Drug use: No    Allergies: No Known Allergies  Medications Prior to Admission  Medication Sig Dispense Refill Last Dose  . acetaminophen (TYLENOL) 325 MG tablet Take 325 mg by mouth every 6 (six) hours as needed.   05/15/2017 at 1130    Review of Systems  Constitutional: Negative for chills and fever.  Gastrointestinal: Positive for nausea. Negative for vomiting.  Genitourinary: Positive for pelvic pain. Negative for dysuria, frequency, vaginal bleeding and vaginal discharge.   Physical Exam   Blood pressure 121/77, pulse  68, temperature 98.1 F (36.7 C), resp. rate 18, height 4\' 11"  (1.499 m), weight 180 lb (81.6 kg), last menstrual period 02/25/2017, unknown if currently breastfeeding.  Physical Exam  Nursing note and vitals reviewed. Constitutional: She is oriented to person, place, and time. She appears well-developed and well-nourished. No distress.  HENT:  Head: Normocephalic.  Cardiovascular: Normal rate.  Respiratory: Effort normal.  GI: Soft. There is no tenderness. There is no rebound.  Neurological: She is alert and oriented to person, place, and time.  Skin: Skin is warm and dry.  Psychiatric: She has a normal mood and affect.    Results for orders placed or performed during the hospital encounter of 05/15/17 (from the past 24 hour(s))  CBC     Status: Abnormal   Collection Time: 05/15/17  8:31 PM  Result Value Ref Range   WBC 10.3 4.0 - 10.5 K/uL   RBC 4.99 3.87 - 5.11 MIL/uL   Hemoglobin 12.1 12.0 - 15.0 g/dL   HCT 40.936.2 81.136.0 - 91.446.0 %   MCV 72.5 (L) 78.0 - 100.0 fL   MCH 24.2 (L) 26.0 - 34.0 pg   MCHC 33.4 30.0 - 36.0 g/dL   RDW 78.217.6 (H) 95.611.5 - 21.315.5 %   Platelets 356 150 - 400 K/uL  Wet prep, genital     Status: Abnormal   Collection Time: 05/15/17  8:40 PM  Result Value Ref Range   Yeast Wet Prep HPF POC NONE SEEN NONE SEEN   Trich, Wet Prep NONE SEEN NONE  SEEN   Clue Cells Wet Prep HPF POC PRESENT (A) NONE SEEN   WBC, Wet Prep HPF POC MODERATE (A) NONE SEEN   Sperm NONE SEEN    [place] Ob Comp Less 14 Wks  Result Date: 05/15/2017 CLINICAL DATA:  Cramping, positive urine pregnancy test EXAM: OBSTETRIC <14 WK ULTRASOUND TECHNIQUE: Transabdominal ultrasound was performed for evaluation of the gestation as well as the maternal uterus and adnexal regions. COMPARISON:  None. FINDINGS: Intrauterine gestational sac: Single intrauterine gestation Yolk sac:  Not seen Embryo:  Visible Cardiac Activity: Visible Heart Rate: 162 bpm ovaries are within CRL:   53.6  mm   12 w 0 d                  US  EDC: 11/27/2017 Subchorionic hemorrhage:  None visualized. Maternal uterus/adnexae: The right ovary measures 1.9 x 2.5 x 2.1 cm. The left ovary measures 2.4 x 3 x 2.2 cm. No significant free fluid. IMPRESSION: Single viable intrauterine pregnancy as above. No specific abnormality is seen Electronically Signed   By: Alvis PangKim  Fujinaga M.D.   On: 05/15/2017 21:11   MAU Course  Procedures  MDM   Assessment and Plan   1. [redacted] weeks gestation of pregnancy   2. Intrauterine pregnancy   3. Round ligament pain    DC home Comfort measures reviewed  2nd Trimester precautions  Bleeding precautions RX: none  Return to MAU as needed FU with OB as planned  Follow-up Information    [facility].   Contact information: [address] [place] [id number] [phone]            Thressa ShellerHeather Haelyn Forgey 05/15/2017, 8:23 PM

## 2017-05-15 NOTE — MAU Note (Signed)
Pt reports she has had lower abd and back pain  Since yesterday. Was in MAU but had to leave before she was scene. Taking tylenol with out relief. Had some spotting lat night but none today

## 2017-05-15 NOTE — Progress Notes (Signed)
Blind swabs done by RN  for wet prep and cultures

## 2017-05-18 LAB — GC/CHLAMYDIA PROBE AMP (~~LOC~~) NOT AT ARMC
Chlamydia: NEGATIVE
Neisseria Gonorrhea: NEGATIVE

## 2017-05-19 NOTE — L&D Delivery Note (Signed)
Patient called with pressure.  RN in room and fetus was in bed at 1430. Cord clamped and cut.  No visible signs of life.  Pt did not want to hold or see the baby.  Visual inspection of fetus appeared grossly normal.  400 mcg of Cytotec placed rectally to help with delivery of placenta.  EBL with delivery of fetus was 250 cc.  Mom stable, support given to patient and family.  Discussed waiting for placenta and what to expect.  Pt declined autopsy or chromosomes.

## 2017-06-18 ENCOUNTER — Emergency Department (HOSPITAL_COMMUNITY): Payer: 59

## 2017-06-18 ENCOUNTER — Encounter (HOSPITAL_COMMUNITY): Payer: Self-pay

## 2017-06-18 ENCOUNTER — Emergency Department (HOSPITAL_COMMUNITY)
Admission: EM | Admit: 2017-06-18 | Discharge: 2017-06-18 | Disposition: A | Discharge status: Home / Self Care | Payer: 59 | Attending: Emergency Medicine | Admitting: Emergency Medicine

## 2017-06-18 ENCOUNTER — Other Ambulatory Visit: Payer: Self-pay

## 2017-06-18 DIAGNOSIS — R51 Headache: Secondary | ICD-10-CM | POA: Diagnosis present

## 2017-06-18 DIAGNOSIS — Y939 Activity, unspecified: Secondary | ICD-10-CM | POA: Diagnosis not present

## 2017-06-18 DIAGNOSIS — S161XXA Strain of muscle, fascia and tendon at neck level, initial encounter: Secondary | ICD-10-CM | POA: Diagnosis not present

## 2017-06-18 DIAGNOSIS — Y999 Unspecified external cause status: Secondary | ICD-10-CM | POA: Insufficient documentation

## 2017-06-18 DIAGNOSIS — S060X1A Concussion with loss of consciousness of 30 minutes or less, initial encounter: Secondary | ICD-10-CM | POA: Diagnosis not present

## 2017-06-18 DIAGNOSIS — Y929 Unspecified place or not applicable: Secondary | ICD-10-CM | POA: Diagnosis not present

## 2017-06-18 MED ORDER — NAPROXEN 500 MG PO TABS: 500.0000 mg | ORAL_TABLET | Freq: Two times a day (BID) | ORAL | 0 refills | Status: DC

## 2017-06-18 MED ORDER — PROMETHAZINE HCL 25 MG PO TABS: 25.0000 mg | ORAL_TABLET | Freq: Three times a day (TID) | ORAL | 0 refills | Status: DC | PRN

## 2017-06-18 MED ORDER — NAPROXEN 500 MG PO TABS
500.0000 mg | ORAL_TABLET | Freq: Once | ORAL | Status: AC
  Administered 2017-06-18: 500 mg via ORAL
  Filled 2017-06-18: qty 1

## 2017-06-18 MED ORDER — CYCLOBENZAPRINE HCL 10 MG PO TABS
10.0000 mg | ORAL_TABLET | Freq: Once | ORAL | Status: AC
  Administered 2017-06-18: 10 mg via ORAL
  Filled 2017-06-18: qty 1

## 2017-06-18 MED ORDER — CYCLOBENZAPRINE HCL 10 MG PO TABS: 10.0000 mg | ORAL_TABLET | Freq: Two times a day (BID) | ORAL | 0 refills | Status: DC | PRN
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
## 2018-03-19 ENCOUNTER — Encounter (HOSPITAL_COMMUNITY): Payer: Self-pay | Admitting: *Deleted

## 2018-03-23 ENCOUNTER — Ambulatory Visit (HOSPITAL_COMMUNITY): Payer: 59

## 2018-03-23 ENCOUNTER — Ambulatory Visit (HOSPITAL_COMMUNITY)
Admission: RE | Admit: 2018-03-23 | Discharge: 2018-03-23 | Disposition: A | Discharge status: Home / Self Care | Payer: 59 | Source: Ambulatory Visit | Attending: Obstetrics and Gynecology | Admitting: Obstetrics and Gynecology

## 2018-03-23 ENCOUNTER — Encounter (HOSPITAL_COMMUNITY): Payer: Self-pay

## 2018-03-23 DIAGNOSIS — Z3A13 13 weeks gestation of pregnancy: Secondary | ICD-10-CM | POA: Insufficient documentation

## 2018-03-23 DIAGNOSIS — N883 Incompetence of cervix uteri: Secondary | ICD-10-CM | POA: Diagnosis not present

## 2018-03-23 DIAGNOSIS — O3431 Maternal care for cervical incompetence, first trimester: Secondary | ICD-10-CM | POA: Diagnosis not present

## 2018-03-23 NOTE — Progress Notes (Signed)
CONSULTATION  Date of Service: 03/23/2018 Requesting Provider: Chip Boer L. Emilee Hero, CNM Reason for Consultation: History Of Preterm Delivery  Megan [name] is a 22 yo G3P1 who is here at 13w 1d dated by LMP and confirmed by first trimester ultrasound. She is here at the request of Nigel Bridgeman, CNM for management of prior history of preterm delivery.  She is overall doing well today without complaints. She denies vaginal bleeding, loss of fluid or uterine contractions.   In discussion of Megan [name]'s prior pregnancy. She recalls experience painless cervical dilation approximately 5 months (20+weeks). She notes that she "felt-funny" but denied contractions, loss of fluid or bleeding. She subsequently had an ultrasound that revealed a shortening cervix with cervical dilation.   She was subsequently admitted to the hospital and delivered at 28 weeks 4 days with possible subclinical infection. She delivered via vaginal delivery.  Past Medical History:  Diagnosis Date  . Premature delivery   . Preterm labor    OB History  Gravida Para Term Preterm AB Living  3 1 0 1 1 1   SAB TAB Ectopic Multiple Live Births  0 1 0 0 1    # Outcome Date GA Lbr Len/2nd Weight Sex Delivery Anes PTL Lv  3 Current           2 TAB              Birth Comments: System Generated. Please review and update pregnancy details.  1 Preterm  [redacted]w[redacted]d    Vag-Spont   LIV   Past Surgical History:  Procedure Laterality Date  . INDUCED ABORTION    . WISDOM TOOTH EXTRACTION     Family History  Problem Relation Age of Onset  . Other Neg Hx    Medications include: Prenatal vitamins  Impression:  1) Preterm delivery secondary to either cervical incompetence vs. Preterm labor  Counseling:  I discussed with Megan [name] the uncertainty regarding whether this was initial cervical incompetence vs. Preterm labor. However, it is clear that she had a preterm delivery.   There are two options for management at this time to include  a history indicated cerclage placement now or perform cervical length screening at 16 weeks and initiate weekly 17P injections by 20 weeks. We would perform cervical lengths at a 1-2 week intervals depending on the exam findings and evaluate cerclage placement if the cervix shortens <2 cm per provider preference.  We discussed the risk and benefits of both options, Megan [name] opted for cervical length screening with 17 P injections. I spoke with Ms. Emilee Hero regarding today's visit and recommendations.  Recommendations: 1) Cervical length screening every 1-2 weeks beginning at 16 weeks 2) Initiate 17 P injections 3) Evaluate ultrasound indicated cerclage if CL <2 cm.  I spent a total of 40 minutes with Megan [name] with >50 in face to face counseling.  All questions were answered.  Novella Olive, MD

## 2018-04-08 ENCOUNTER — Encounter (HOSPITAL_COMMUNITY): Payer: Self-pay

## 2018-04-08 ENCOUNTER — Other Ambulatory Visit: Payer: Self-pay

## 2018-04-08 ENCOUNTER — Inpatient Hospital Stay (HOSPITAL_BASED_OUTPATIENT_CLINIC_OR_DEPARTMENT_OTHER): Payer: 59

## 2018-04-08 ENCOUNTER — Inpatient Hospital Stay (HOSPITAL_COMMUNITY)
Admission: AD | Admit: 2018-04-08 | Discharge: 2018-04-11 | DRG: 770 | Disposition: A | Discharge status: Home / Self Care | Payer: 59 | Attending: Obstetrics and Gynecology | Admitting: Obstetrics and Gynecology

## 2018-04-08 DIAGNOSIS — O039 Complete or unspecified spontaneous abortion without complication: Secondary | ICD-10-CM | POA: Diagnosis present

## 2018-04-08 DIAGNOSIS — O9989 Other specified diseases and conditions complicating pregnancy, childbirth and the puerperium: Secondary | ICD-10-CM

## 2018-04-08 DIAGNOSIS — R109 Unspecified abdominal pain: Secondary | ICD-10-CM | POA: Diagnosis present

## 2018-04-08 DIAGNOSIS — O26879 Cervical shortening, unspecified trimester: Secondary | ICD-10-CM

## 2018-04-08 DIAGNOSIS — O09212 Supervision of pregnancy with history of pre-term labor, second trimester: Secondary | ICD-10-CM

## 2018-04-08 DIAGNOSIS — Z3A15 15 weeks gestation of pregnancy: Secondary | ICD-10-CM | POA: Diagnosis not present

## 2018-04-08 DIAGNOSIS — O26872 Cervical shortening, second trimester: Secondary | ICD-10-CM

## 2018-04-08 LAB — WET PREP, GENITAL
Sperm: NONE SEEN
TRICH WET PREP: NONE SEEN
YEAST WET PREP: NONE SEEN

## 2018-04-08 LAB — TYPE AND SCREEN
ABO/RH(D): AB POS
Antibody Screen: NEGATIVE

## 2018-04-08 LAB — CBC
HEMATOCRIT: 36.4 % (ref 36.0–46.0)
HEMOGLOBIN: 11.8 g/dL — AB (ref 12.0–15.0)
MCH: 24.3 pg — AB (ref 26.0–34.0)
MCHC: 32.4 g/dL (ref 30.0–36.0)
MCV: 75.1 fL — AB (ref 80.0–100.0)
Platelets: 350 10*3/uL (ref 150–400)
RBC: 4.85 MIL/uL (ref 3.87–5.11)
RDW: 16.8 % — AB (ref 11.5–15.5)
WBC: 14 10*3/uL — ABNORMAL HIGH (ref 4.0–10.5)
nRBC: 0 % (ref 0.0–0.2)

## 2018-04-08 MED ORDER — AZITHROMYCIN 250 MG PO TABS
500.0000 mg | ORAL_TABLET | Freq: Every day | ORAL | Status: DC
  Administered 2018-04-08 – 2018-04-09 (×2): 500 mg via ORAL
  Filled 2018-04-08 (×2): qty 2

## 2018-04-08 MED ORDER — DEXTROSE IN LACTATED RINGERS 5 % IV SOLN
INTRAVENOUS | Status: DC
  Administered 2018-04-08 – 2018-04-10 (×7): via INTRAVENOUS

## 2018-04-08 MED ORDER — SODIUM CHLORIDE 0.9 % IV SOLN
2.0000 g | Freq: Four times a day (QID) | INTRAVENOUS | Status: DC
  Administered 2018-04-08 – 2018-04-10 (×7): 2 g via INTRAVENOUS
  Filled 2018-04-08 (×3): qty 2
  Filled 2018-04-08: qty 2000
  Filled 2018-04-08: qty 2
  Filled 2018-04-08: qty 2000
  Filled 2018-04-08 (×3): qty 2

## 2018-04-08 MED ORDER — ZOLPIDEM TARTRATE 5 MG PO TABS: 5.0000 mg | ORAL_TABLET | Freq: Every evening | ORAL | Status: DC | PRN

## 2018-04-08 MED ORDER — DOCUSATE SODIUM 100 MG PO CAPS
100.0000 mg | ORAL_CAPSULE | Freq: Every day | ORAL | Status: DC
  Filled 2018-04-08 (×3): qty 1

## 2018-04-08 MED ORDER — AMOXICILLIN 500 MG PO CAPS: 500.0000 mg | ORAL_CAPSULE | Freq: Three times a day (TID) | ORAL | Status: DC

## 2018-04-08 MED ORDER — ACETAMINOPHEN 325 MG PO TABS
650.0000 mg | ORAL_TABLET | ORAL | Status: DC | PRN
  Administered 2018-04-08 – 2018-04-09 (×4): 650 mg via ORAL
  Filled 2018-04-08 (×4): qty 2

## 2018-04-08 MED ORDER — PRENATAL MULTIVITAMIN CH
1.0000 | ORAL_TABLET | Freq: Every day | ORAL | Status: DC
  Administered 2018-04-10: 1 via ORAL
  Filled 2018-04-08: qty 1

## 2018-04-08 MED ORDER — CALCIUM CARBONATE ANTACID 500 MG PO CHEW: 2.0000 | CHEWABLE_TABLET | ORAL | Status: DC | PRN

## 2018-04-08 NOTE — MAU Note (Signed)
This morning when she woke up, she was cramping really bad and having pain in her back.  When she went to the bathroom, she had started bleeding and clots were coming out.  Now having pain in RLQ, hurts really bad. 1st episode of bleeding.

## 2018-04-08 NOTE — H&P (Signed)
Megan [name] is March 09, 1996  Patient's last menstrual period was 12/21/2017. 1440w3d  Pt presents with 15.2WKS CRAMPING AND BLEEDING  Pregnancy complicated by H/O preterm delivery  PMHX  Past Medical History:  Diagnosis Date  . Premature delivery   . Preterm labor     PSURG HX  Past Surgical History:  Procedure Laterality Date  . INDUCED ABORTION    . WISDOM TOOTH EXTRACTION      Fam HX  Family History  Problem Relation Age of Onset  . Other Neg Hx     Soc HX  Social History   Socioeconomic History  . Marital status: Single    Spouse name: Not on file  . Number of children: Not on file  . Years of education: Not on file  . Highest education level: Not on file  Occupational History  . Not on file  Social Needs  . Financial resource strain: Not on file  . Food insecurity:    Worry: Not on file    Inability: Not on file  . Transportation needs:    Medical: Not on file    Non-medical: Not on file  Tobacco Use  . Smoking status: Never Smoker  . Smokeless tobacco: Never Used  Substance and Sexual Activity  . Alcohol use: Not Currently  . Drug use: No  . Sexual activity: Yes    Birth control/protection: None  Lifestyle  . Physical activity:    Days per week: Not on file    Minutes per session: Not on file  . Stress: Not on file  Relationships  . Social connections:    Talks on phone: Not on file    Gets together: Not on file    Attends religious service: Not on file    Active member of club or organization: Not on file    Attends meetings of clubs or organizations: Not on file    Relationship status: Not on file  . Intimate partner violence:    Fear of current or ex partner: Not on file    Emotionally abused: Not on file    Physically abused: Not on file    Forced sexual activity: Not on file  Other Topics Concern  . Not on file  Social History Narrative  . Not on file    ROS  Review of Systems - Negative except GU  US SIUP pending position.     Gen WDWN  IN NAD CV RRR LUNGS CTAB ABD Gravid soft NT GU bulging bag in the vagina.  No cervix seen.  Minimal bleeding EXT no C/C/E A/P: IUP 4740w3d advanced cervical dilation  Admit to antepartum Pt desires observation Will do US Start antibiotics Expectant management

## 2018-04-09 LAB — GC/CHLAMYDIA PROBE AMP (~~LOC~~) NOT AT ARMC
CHLAMYDIA, DNA PROBE: NEGATIVE
NEISSERIA GONORRHEA: NEGATIVE

## 2018-04-09 NOTE — Progress Notes (Addendum)
Megan [name], Megan [name], 22 y.o., 1996-04-12 MRN: [id number]  Subjective: Patient reports gush of fluid today at 12.15 pm, she has continued with leakage of fluid since then.  She also reports abdominal cramping which is tolerable. She denies fevers/chills.   Objective: I have reviewed patient's vital signs, medications and labs. Blood pressure 124/71, pulse 91, temperature 97.9 F (36.6 C), temperature source Oral, resp. rate 16, height 5' (1.524 m), weight 83 kg, last menstrual period 12/21/2017, SpO2 100 %, unknown if currently breastfeeding. General: alert, cooperative and no distress GI: Soft, tender to palpation in lower abdomen, no rebound, no guarding.  Extremities: extremities normal, atraumatic, no cyanosis or edema Vaginal Bleeding: minimal Grossly ruptured clear fluid per RN.   CBC    Component Value Date/Time   WBC 14.0 (H) 04/08/2018 1346   RBC 4.85 04/08/2018 1346   HGB 11.8 (L) 04/08/2018 1346   HCT 36.4 04/08/2018 1346   PLT 350 04/08/2018 1346   MCV 75.1 (L) 04/08/2018 1346   MCH 24.3 (L) 04/08/2018 1346   MCHC 32.4 04/08/2018 1346   RDW 16.8 (H) 04/08/2018 1346   LYMPHSABS 3.8 02/07/2014 1852   MONOABS 0.6 02/07/2014 1852   EOSABS 0.1 02/07/2014 1852   BASOSABS 0.0 02/07/2014 1852   OB Ultrasound 04/08/2018:   Cervix Uterus Adnexa  Cervix  Hourglass membranes into the vagina.  Adnexa  No abnormality visualized. ---------------------------------------------------------------------- Impression  Patient was evaluated at [facility] for c/o abdominal cramping.  She has a history of preterm delivery at 6628 weeks' gestation.  A limited ultrasound study was performed. Amniotic fluid is  normal and good fetal activity is seen. On transabdominal  scan, the cervical canal is completely dilated with feet in the  amniotic membrane (cervix).  I discussed with Dr. Normand Sloopillard, who informed me that on  speculum exam, the cervix was fully dilated and the  membranes were more  than one-fourth into the vagina.  Patient is not a candidate for rescue cerclage.  I discussed the findings with the patient. I recommended  prophylactic cerclage in her next pregnancy. ----------------------------------------------------------------------                  Megan Spaceavi Shankar, MD Electronically Signed Final Report   04/08/2018 01:47 pm ----------------------------------------------------------------------  Current Facility-Administered Medications:  .  acetaminophen (TYLENOL) tablet 650 mg, 650 mg, Oral, Q4H PRN, Dillard, Naima, MD, 650 mg at 04/09/18 0857 .  ampicillin (OMNIPEN) 2 g in sodium chloride 0.9 % 100 mL IVPB, 2 g, Intravenous, Q6H, Last Rate: 300 mL/hr at 04/09/18 0938, 2 g at 04/09/18 0938 **FOLLOWED BY** [START ON 04/10/2018] amoxicillin (AMOXIL) capsule 500 mg, 500 mg, Oral, Q8H, Dillard, Naima, MD .  azithromycin (ZITHROMAX) tablet 500 mg, 500 mg, Oral, Daily, Dillard, Naima, MD, 500 mg at 04/09/18 0857 .  calcium carbonate (TUMS - dosed in mg elemental calcium) chewable tablet 400 mg of elemental calcium, 2 tablet, Oral, Q4H PRN, Dillard, Naima, MD .  dextrose 5 % in lactated ringers infusion, , Intravenous, Continuous, Dillard, Naima, MD, Last Rate: 100 mL/hr at 04/09/18 0937 .  docusate sodium (COLACE) capsule 100 mg, 100 mg, Oral, Daily, Dillard, Naima, MD .  prenatal multivitamin tablet 1 tablet, 1 tablet, Oral, Q1200, Dillard, Naima, MD .  zolpidem (AMBIEN) tablet 5 mg, 5 mg, Oral, QHS PRN, Dillard, Naima, MD  Assessment/Plan: 22 y/o G3P0111 @ 15 weeks 4 days EGA with cervical incompetence and now with Preterm premature rupture of membranes (PPROM)  Management options discussed with patient to  include expectant management in hospital versus expectant management at home versus induction of labor and delivery.  Patient at this time desires continued expectant management in the hospital.  She lives about 20 minutes drive from the hospital and does not feel  comfortable delivering at home. All her questions were answered and she expressed understanding.  LOS: 1 day    [facility]. MD.  04/09/2018, 2:18 PM Total time spent on patient 30 minutes with more than 50% of time spent on face to face visit with patient.  Dr. Sallye Ober. 04/09/2018.  1439.

## 2018-04-09 NOTE — Plan of Care (Signed)
  Problem: Education: Goal: Knowledge of disease or condition will improve Outcome: Progressing   Problem: Education: Goal: Knowledge of the prescribed therapeutic regimen will improve Outcome: Progressing   Problem: Coping: Goal: Level of anxiety will decrease Outcome: Progressing   Problem: Coping: Goal: Ability to identify and develop effective coping behavior will improve Outcome: Progressing   Problem: Coping: Goal: Participation in decision-making will improve Outcome: Progressing

## 2018-04-09 NOTE — Progress Notes (Signed)
I offered initial spiritual and emotional support.  Pt was on the phone and not available for a deeper conversation.  I will refer to Chaplain who is on call tonight.  Chaplain Dyanne CarrelKaty Manjinder Breau, Bcc Pager, [phone] 3:19 PM

## 2018-04-10 ENCOUNTER — Inpatient Hospital Stay (HOSPITAL_COMMUNITY): Payer: 59

## 2018-04-10 ENCOUNTER — Inpatient Hospital Stay (HOSPITAL_COMMUNITY): Payer: 59 | Admitting: Anesthesiology

## 2018-04-10 ENCOUNTER — Encounter (HOSPITAL_COMMUNITY)
Admission: AD | Disposition: A | Discharge status: Home / Self Care | Payer: Self-pay | Source: Home / Self Care | Attending: Obstetrics and Gynecology

## 2018-04-10 DIAGNOSIS — O039 Complete or unspecified spontaneous abortion without complication: Secondary | ICD-10-CM | POA: Diagnosis present

## 2018-04-10 HISTORY — PX: DILATION AND EVACUATION: SHX1459

## 2018-04-10 LAB — CBC WITH DIFFERENTIAL/PLATELET
BASOS PCT: 0 %
Basophils Absolute: 0 10*3/uL (ref 0.0–0.1)
EOS ABS: 0.1 10*3/uL (ref 0.0–0.5)
EOS PCT: 0 %
HCT: 32.8 % — ABNORMAL LOW (ref 36.0–46.0)
Hemoglobin: 10.6 g/dL — ABNORMAL LOW (ref 12.0–15.0)
Lymphocytes Relative: 18 %
Lymphs Abs: 2.2 10*3/uL (ref 0.7–4.0)
MCH: 24 pg — ABNORMAL LOW (ref 26.0–34.0)
MCHC: 32.3 g/dL (ref 30.0–36.0)
MCV: 74.4 fL — ABNORMAL LOW (ref 80.0–100.0)
MONO ABS: 0.6 10*3/uL (ref 0.1–1.0)
Monocytes Relative: 5 %
NRBC: 0 % (ref 0.0–0.2)
Neutro Abs: 9.3 10*3/uL — ABNORMAL HIGH (ref 1.7–7.7)
Neutrophils Relative %: 77 %
PLATELETS: 345 10*3/uL (ref 150–400)
RBC: 4.41 MIL/uL (ref 3.87–5.11)
RDW: 16.6 % — AB (ref 11.5–15.5)
WBC: 12.2 10*3/uL — AB (ref 4.0–10.5)

## 2018-04-10 SURGERY — DILATION AND EVACUATION, UTERUS
Anesthesia: General

## 2018-04-10 SURGERY — Surgical Case
Anesthesia: *Unknown

## 2018-04-10 MED ORDER — PROPOFOL 10 MG/ML IV BOLUS
INTRAVENOUS | Status: DC | PRN
  Administered 2018-04-10: 50 mg via INTRAVENOUS
  Administered 2018-04-10: 200 mg via INTRAVENOUS

## 2018-04-10 MED ORDER — SCOPOLAMINE 1 MG/3DAYS TD PT72
MEDICATED_PATCH | TRANSDERMAL | Status: DC | PRN
  Administered 2018-04-10: 1 via TRANSDERMAL

## 2018-04-10 MED ORDER — PHENYLEPHRINE 40 MCG/ML (10ML) SYRINGE FOR IV PUSH (FOR BLOOD PRESSURE SUPPORT)
PREFILLED_SYRINGE | INTRAVENOUS | Status: AC
  Filled 2018-04-10: qty 10

## 2018-04-10 MED ORDER — ONDANSETRON HCL 4 MG/2ML IJ SOLN
INTRAMUSCULAR | Status: AC
  Filled 2018-04-10: qty 2

## 2018-04-10 MED ORDER — SUCCINYLCHOLINE CHLORIDE 20 MG/ML IJ SOLN
INTRAMUSCULAR | Status: DC | PRN
  Administered 2018-04-10: 120 mg via INTRAVENOUS

## 2018-04-10 MED ORDER — BENZOCAINE-MENTHOL 20-0.5 % EX AERO
1.0000 "application " | INHALATION_SPRAY | CUTANEOUS | Status: DC | PRN
  Filled 2018-04-10: qty 56

## 2018-04-10 MED ORDER — SODIUM CHLORIDE 0.9 % IV SOLN
INTRAVENOUS | Status: DC | PRN
  Administered 2018-04-10: 22:00:00 via INTRAVENOUS

## 2018-04-10 MED ORDER — ACETAMINOPHEN 325 MG PO TABS: 650.0000 mg | ORAL_TABLET | ORAL | Status: DC | PRN

## 2018-04-10 MED ORDER — KETOROLAC TROMETHAMINE 30 MG/ML IJ SOLN
INTRAMUSCULAR | Status: AC
  Administered 2018-04-10: 30 mg
  Filled 2018-04-10: qty 1

## 2018-04-10 MED ORDER — DEXAMETHASONE SODIUM PHOSPHATE 4 MG/ML IJ SOLN
INTRAMUSCULAR | Status: AC
  Filled 2018-04-10: qty 1

## 2018-04-10 MED ORDER — MISOPROSTOL 200 MCG PO TABS
400.0000 ug | ORAL_TABLET | ORAL | Status: DC | PRN
  Administered 2018-04-10 (×3): 400 ug via VAGINAL
  Filled 2018-04-10 (×3): qty 2

## 2018-04-10 MED ORDER — HYDROCODONE-ACETAMINOPHEN 7.5-325 MG PO TABS: 1.0000 | ORAL_TABLET | Freq: Once | ORAL | Status: DC | PRN

## 2018-04-10 MED ORDER — DIPHENHYDRAMINE HCL 25 MG PO CAPS
25.0000 mg | ORAL_CAPSULE | Freq: Four times a day (QID) | ORAL | Status: DC | PRN
  Filled 2018-04-10: qty 1

## 2018-04-10 MED ORDER — LACTATED RINGERS IV SOLN
INTRAVENOUS | Status: DC | PRN
  Administered 2018-04-10: 22:00:00 via INTRAVENOUS

## 2018-04-10 MED ORDER — PROPOFOL 10 MG/ML IV BOLUS
INTRAVENOUS | Status: AC
  Filled 2018-04-10: qty 20

## 2018-04-10 MED ORDER — PRENATAL MULTIVITAMIN CH
1.0000 | ORAL_TABLET | Freq: Every day | ORAL | Status: DC
  Filled 2018-04-10: qty 1

## 2018-04-10 MED ORDER — PROMETHAZINE HCL 25 MG/ML IJ SOLN: 6.2500 mg | INTRAMUSCULAR | Status: DC | PRN

## 2018-04-10 MED ORDER — MISOPROSTOL 200 MCG PO TABS
800.0000 ug | ORAL_TABLET | Freq: Once | ORAL | Status: AC
  Administered 2018-04-10: 800 ug via VAGINAL
  Filled 2018-04-10: qty 4

## 2018-04-10 MED ORDER — ONDANSETRON HCL 4 MG PO TABS: 4.0000 mg | ORAL_TABLET | ORAL | Status: DC | PRN

## 2018-04-10 MED ORDER — TRANEXAMIC ACID-NACL 1000-0.7 MG/100ML-% IV SOLN
1000.0000 mg | INTRAVENOUS | Status: DC
  Filled 2018-04-10: qty 100

## 2018-04-10 MED ORDER — OXYTOCIN 10 UNIT/ML IJ SOLN
INTRAMUSCULAR | Status: DC | PRN
  Administered 2018-04-10: 40 [IU] via INTRAMUSCULAR

## 2018-04-10 MED ORDER — MISOPROSTOL 200 MCG PO TABS
ORAL_TABLET | ORAL | Status: AC
  Filled 2018-04-10: qty 4

## 2018-04-10 MED ORDER — SIMETHICONE 80 MG PO CHEW
80.0000 mg | CHEWABLE_TABLET | ORAL | Status: DC | PRN
  Filled 2018-04-10: qty 1

## 2018-04-10 MED ORDER — FENTANYL CITRATE (PF) 100 MCG/2ML IJ SOLN
INTRAMUSCULAR | Status: DC | PRN
  Administered 2018-04-10: 100 ug via INTRAVENOUS

## 2018-04-10 MED ORDER — SOD CITRATE-CITRIC ACID 500-334 MG/5ML PO SOLN
ORAL | Status: AC
  Administered 2018-04-10: 21:00:00
  Filled 2018-04-10: qty 15

## 2018-04-10 MED ORDER — IBUPROFEN 600 MG PO TABS
600.0000 mg | ORAL_TABLET | Freq: Four times a day (QID) | ORAL | Status: DC
  Filled 2018-04-10: qty 1

## 2018-04-10 MED ORDER — ONDANSETRON HCL 4 MG/2ML IJ SOLN: 4.0000 mg | INTRAMUSCULAR | Status: DC | PRN

## 2018-04-10 MED ORDER — DEXAMETHASONE SODIUM PHOSPHATE 4 MG/ML IJ SOLN
INTRAMUSCULAR | Status: DC | PRN
  Administered 2018-04-10: 4 mg via INTRAVENOUS

## 2018-04-10 MED ORDER — MISOPROSTOL 25 MCG QUARTER TABLET
ORAL_TABLET | ORAL | Status: DC | PRN
  Administered 2018-04-10: 800 ug via RECTAL

## 2018-04-10 MED ORDER — FENTANYL CITRATE (PF) 100 MCG/2ML IJ SOLN
INTRAMUSCULAR | Status: AC
  Filled 2018-04-10: qty 2

## 2018-04-10 MED ORDER — SCOPOLAMINE 1 MG/3DAYS TD PT72
MEDICATED_PATCH | TRANSDERMAL | Status: AC
  Filled 2018-04-10: qty 1

## 2018-04-10 MED ORDER — ONDANSETRON HCL 4 MG/2ML IJ SOLN
INTRAMUSCULAR | Status: DC | PRN
  Administered 2018-04-10: 4 mg via INTRAVENOUS

## 2018-04-10 MED ORDER — KETOROLAC TROMETHAMINE 30 MG/ML IJ SOLN: 30.0000 mg | Freq: Once | INTRAMUSCULAR | Status: AC | PRN

## 2018-04-10 MED ORDER — BUTORPHANOL TARTRATE 1 MG/ML IJ SOLN
1.0000 mg | INTRAMUSCULAR | Status: DC | PRN
  Administered 2018-04-10 (×5): 1 mg via INTRAVENOUS
  Filled 2018-04-10 (×5): qty 1

## 2018-04-10 MED ORDER — FENTANYL CITRATE (PF) 100 MCG/2ML IJ SOLN: 25.0000 ug | INTRAMUSCULAR | Status: DC | PRN

## 2018-04-10 MED ORDER — SODIUM CHLORIDE 0.9 % IR SOLN
Status: DC | PRN
  Administered 2018-04-10: 1000 mL

## 2018-04-10 MED ORDER — LIDOCAINE HCL (CARDIAC) PF 100 MG/5ML IV SOSY
PREFILLED_SYRINGE | INTRAVENOUS | Status: DC | PRN
  Administered 2018-04-10: 100 mg via INTRAVENOUS

## 2018-04-10 MED ORDER — SENNOSIDES-DOCUSATE SODIUM 8.6-50 MG PO TABS
2.0000 | ORAL_TABLET | ORAL | Status: DC
  Filled 2018-04-10 (×3): qty 2

## 2018-04-10 MED ORDER — CEFAZOLIN SODIUM-DEXTROSE 2-4 GM/100ML-% IV SOLN
2.0000 g | Freq: Once | INTRAVENOUS | Status: AC
  Administered 2018-04-10: 2 g via INTRAVENOUS
  Filled 2018-04-10: qty 100

## 2018-04-10 MED ORDER — LIDOCAINE HCL (CARDIAC) PF 100 MG/5ML IV SOSY
PREFILLED_SYRINGE | INTRAVENOUS | Status: AC
  Filled 2018-04-10: qty 5

## 2018-04-10 MED ORDER — MIDAZOLAM HCL 2 MG/2ML IJ SOLN
INTRAMUSCULAR | Status: AC
  Filled 2018-04-10: qty 2

## 2018-04-10 MED ORDER — OXYTOCIN 40 UNITS IN LACTATED RINGERS INFUSION - SIMPLE MED
INTRAVENOUS | Status: AC
  Administered 2018-04-10: 133.333 m[IU]/min via INTRAVENOUS
  Filled 2018-04-10: qty 1000

## 2018-04-10 MED ORDER — PHENYLEPHRINE HCL 10 MG/ML IJ SOLN
INTRAMUSCULAR | Status: DC | PRN
  Administered 2018-04-10 (×2): .8 mg via INTRAVENOUS

## 2018-04-10 MED ORDER — OXYTOCIN 40 UNITS IN LACTATED RINGERS INFUSION - SIMPLE MED
200.0000 m[IU]/min | INTRAVENOUS | Status: DC
  Administered 2018-04-10: 133.333 m[IU]/min via INTRAVENOUS

## 2018-04-10 MED ORDER — MEPERIDINE HCL 25 MG/ML IJ SOLN: 6.2500 mg | INTRAMUSCULAR | Status: DC | PRN

## 2018-04-10 MED ORDER — MIDAZOLAM HCL 2 MG/2ML IJ SOLN
INTRAMUSCULAR | Status: DC | PRN
  Administered 2018-04-10: 2 mg via INTRAVENOUS

## 2018-04-10 SURGICAL SUPPLY — 23 items
ADAPTER VACURETTE TBG SET 14 (CANNULA) ×2 IMPLANT
CATH ROBINSON RED A/P 16FR (CATHETERS) ×2 IMPLANT
CLOTH BEACON ORANGE TIMEOUT ST (SAFETY) ×2 IMPLANT
DECANTER SPIKE VIAL GLASS SM (MISCELLANEOUS) ×2 IMPLANT
GLOVE BIO SURGEON STRL SZ 6.5 (GLOVE) ×4 IMPLANT
GLOVE BIOGEL PI IND STRL 6.5 (GLOVE) ×1 IMPLANT
GLOVE BIOGEL PI IND STRL 7.0 (GLOVE) ×1 IMPLANT
GLOVE BIOGEL PI INDICATOR 6.5 (GLOVE) ×1
GLOVE BIOGEL PI INDICATOR 7.0 (GLOVE) ×1
GOWN STRL REUS W/TWL LRG LVL3 (GOWN DISPOSABLE) ×4 IMPLANT
KIT BERKELEY 1ST TRIMESTER 3/8 (MISCELLANEOUS) ×2 IMPLANT
NS IRRIG 1000ML POUR BTL (IV SOLUTION) ×2 IMPLANT
PACK VAGINAL MINOR WOMEN LF (CUSTOM PROCEDURE TRAY) ×2 IMPLANT
PAD OB MATERNITY 4.3X12.25 (PERSONAL CARE ITEMS) ×2 IMPLANT
PAD PREP 24X48 CUFFED NSTRL (MISCELLANEOUS) ×2 IMPLANT
SET BERKELEY SUCTION TUBING (SUCTIONS) ×2 IMPLANT
TOWEL OR 17X24 6PK STRL BLUE (TOWEL DISPOSABLE) ×4 IMPLANT
TUBE VACURETTE 2ND TRIMESTER (CANNULA) ×2 IMPLANT
VACURETTE 10 RIGID CVD (CANNULA) IMPLANT
VACURETTE 14MM CVD 1/2 BASE (CANNULA) ×2 IMPLANT
VACURETTE 7MM CVD STRL WRAP (CANNULA) IMPLANT
VACURETTE 8 RIGID CVD (CANNULA) IMPLANT
VACURETTE 9 RIGID CVD (CANNULA) IMPLANT

## 2018-04-10 NOTE — Progress Notes (Signed)
S:  Pt having occ cramping, resting comfortably.  Bleeding minimal O: Vitals:   04/10/18 1133 04/10/18 1601  BP: 109/62 122/76  Pulse: 73 83  Resp: 18 16  Temp: 97.9 F (36.6 C) 97.7 F (36.5 C)  SpO2: 100% 100%   A: Retained placenta P:  Discussed poc with Dr. Charlotta Newtonzan.  400 mcg placed vaginally per plan Will monitor for one hour.  If no placenta will have Dr. Charlotta Newtonzan in to evaluate.  Pt aware of plan.

## 2018-04-10 NOTE — Anesthesia Preprocedure Evaluation (Addendum)
Anesthesia Evaluation  Patient identified by MRN, date of birth, ID band Patient awake    Reviewed: Allergy & Precautions, NPO status   Airway Mallampati: II  TM Distance: >3 FB Neck ROM: Full    Dental no notable dental hx. (+) Teeth Intact, Dental Advisory Given   Pulmonary neg pulmonary ROS,    Pulmonary exam normal breath sounds clear to auscultation       Cardiovascular negative cardio ROS Normal cardiovascular exam Rhythm:Regular Rate:Normal     Neuro/Psych negative neurological ROS  negative psych ROS   GI/Hepatic negative GI ROS, Neg liver ROS,   Endo/Other  negative endocrine ROS  Renal/GU negative Renal ROS     Musculoskeletal negative musculoskeletal ROS (+)   Abdominal (+) + obese,   Peds  Hematology negative hematology ROS (+)   Anesthesia Other Findings   Reproductive/Obstetrics                            Lab Results  Component Value Date   WBC 12.2 (H) 04/10/2018   HGB 10.6 (L) 04/10/2018   HCT 32.8 (L) 04/10/2018   MCV 74.4 (L) 04/10/2018   PLT 345 04/10/2018    Anesthesia Physical Anesthesia Plan  ASA: II and emergent  Anesthesia Plan: General   Post-op Pain Management:    Induction: Intravenous, Cricoid pressure planned and Rapid sequence  PONV Risk Score and Plan: 3 and Treatment may vary due to age or medical condition, Ondansetron and Dexamethasone  Airway Management Planned: Oral ETT  Additional Equipment:   Intra-op Plan:   Post-operative Plan: Extubation in OR  Informed Consent: I have reviewed the patients History and Physical, chart, labs and discussed the procedure including the risks, benefits and alternatives for the proposed anesthesia with the patient or authorized representative who has indicated his/her understanding and acceptance.   Dental advisory given  Plan Discussed with:   Anesthesia Plan Comments:        Anesthesia  Quick Evaluation

## 2018-04-10 NOTE — Progress Notes (Signed)
At bedside to evaluate retained placenta.  Exam performed with difficulty due to patient discomfort.  Portion of placenta noted through os- unable to remove placenta with light traction.  Plan for exam under anesthesia, D&E due to retained placenta.  Reviewed risk,benefit and indications including but not limited to risk of bleeding, infection and injury such as uterine perforation.  Questions and concerns were addressed and she desires to proceed.  Myna HidalgoJennifer Loman Logan, DO [phone] (cell) [phone] (office)

## 2018-04-10 NOTE — Progress Notes (Signed)
Fetus delivered spontaneously; Bernerd PhoNancy Prothero called to cut cord. Fetus intact; no signs of life; no heartbeat or respiratory effort. Mother and father declined to hold fetal remains at this time.

## 2018-04-10 NOTE — Transfer of Care (Signed)
Immediate Anesthesia Transfer of Care Note  Patient: Megan OliphantJasmine T [name]  Procedure(s) Performed: DILATATION AND EVACUATION (N/A )  Patient Location: PACU  Anesthesia Type:General  Level of Consciousness: awake, oriented and patient cooperative  Airway & Oxygen Therapy: Patient Spontanous Breathing and Patient connected to nasal cannula oxygen  Post-op Assessment: Report given to RN and Post -op Vital signs reviewed and stable  Post vital signs: Reviewed and stable  Last Vitals:  Vitals Value Taken Time  BP 126/75 04/10/2018 10:30 PM  Temp    Pulse 104 04/10/2018 10:35 PM  Resp 21 04/10/2018 10:35 PM  SpO2 96 % 04/10/2018 10:35 PM  Vitals shown include unvalidated device data.  Last Pain:  Vitals:   04/10/18 2059  TempSrc:   PainSc: 6       Patients Stated Pain Goal: 3 (04/10/18 1921)  Complications: No apparent anesthesia complications

## 2018-04-10 NOTE — Progress Notes (Signed)
At bedside to review management plan.  Pt feels comfortable with proceed with IOL.  Denies fever or chills.  Feeling some pelvic pressure.    O: BP 116/75 (BP Location: Right Arm)   Pulse 83   Temp 98.5 F (36.9 C)   Resp 17   Ht 5' (1.524 m)   Wt 83.5 kg   LMP 12/21/2017   SpO2 99%   BMI 35.94 kg/m   Gen: NAD Lungs: normal respiratory effor Abd: soft and non-tender SVE: fetal parts present in vaginal vault, cytotec placed Ext: no edema, no calf tenderness bilaterally  Results for orders placed or performed during the hospital encounter of 04/08/18 (from the past 24 hour(s))  CBC with Differential/Platelet     Status: Abnormal   Collection Time: 04/10/18  7:28 AM  Result Value Ref Range   WBC 12.2 (H) 4.0 - 10.5 K/uL   RBC 4.41 3.87 - 5.11 MIL/uL   Hemoglobin 10.6 (L) 12.0 - 15.0 g/dL   HCT 69.632.8 (L) 29.536.0 - 28.446.0 %   MCV 74.4 (L) 80.0 - 100.0 fL   MCH 24.0 (L) 26.0 - 34.0 pg   MCHC 32.3 30.0 - 36.0 g/dL   RDW 13.216.6 (H) 44.011.5 - 10.215.5 %   Platelets 345 150 - 400 K/uL   nRBC 0.0 0.0 - 0.2 %   Neutrophils Relative % 77 %   Neutro Abs 9.3 (H) 1.7 - 7.7 K/uL   Lymphocytes Relative 18 %   Lymphs Abs 2.2 0.7 - 4.0 K/uL   Monocytes Relative 5 %   Monocytes Absolute 0.6 0.1 - 1.0 K/uL   Eosinophils Relative 0 %   Eosinophils Absolute 0.1 0.0 - 0.5 K/uL   Basophils Relative 0 %   Basophils Absolute 0.0 0.0 - 0.1 K/uL   A/P: 22yo [id number] @ 6284w5d with inevitable AB due to PPROM  -NPO -Cytotec 800mcg vaginally then 400mcg q 4hr -IV Stadol as needed -LR @ 125/hr -currently no evidence of infection  Myna HidalgoJennifer Samyra Limb, DO [phone] (cell) [phone] (office)

## 2018-04-10 NOTE — Progress Notes (Signed)
S:  Pt cramping.  Minimal bleeding noted.  Pain controlled.  Grieving normally. O:  VSS SVE placenta partially in vagina cervix still open to 3 cm.  A:Retained placenta after spontaneous abortion P:  Had pt up to BR voided without difficulty.  Had patient squat with no descent of placenta Consult with Dr. Charlotta Newtonzan, will do IV pitocin at 200/cc an hour.  Pt EBL at this time 250cc.  Total 500cc.  Bleeding minimal once pitocin started.

## 2018-04-10 NOTE — Op Note (Signed)
Preoperative diagnosis: Retained placenta  Postoperative diagnosis: Same  Anesthesia: General  Procedure: Dilatation and evacuation  Surgeon: Dr. Myna HidalgoJennifer Carling Liberman  Estimated blood loss: 182cc UOP: 30cc IVF: 1300cc  Procedure:  Pt was taken to the OR where general anesthesia was performed.  She was placed in lithotomy position.  She was prepped and draped in a sterile fashion.  The bladder was drained using a red rubber.  Sterile speculum was placed- os appeared ~ 4cm dilated.  Under US guidance sharp curettage was performed with removal of blood clots and a small portion of placenta.  Using a #14 curved cannula, we proceed with evacuation of placenta without difficulty.  Uterine lining appeared thin.  Active bleeding was then noted- pt received cytotec 800mcg per rectum and IV Pitocin.  Bleeding noted to improve significantly. Instruments were removed. The procedure was well tolerated by the patient and she was taken to recovery room in a well and stable condition.  Specimen: placenta

## 2018-04-10 NOTE — Progress Notes (Signed)
Vaginal bleeding small amount. Placenta still undelivered. Instructed patient to call if she felt a gush of blood or pelvic pressure.

## 2018-04-10 NOTE — Progress Notes (Signed)
S:Pt feeling cramping and received a dose of Stadol.  States helped with pain.   O: Vitals:   04/10/18 0401 04/10/18 0857  BP: 116/75 117/62  Pulse: 83 82  Resp: 17 16  Temp: 98.5 F (36.9 C) 97.8 F (36.6 C)  SpO2: 99% 100%   A: Inevitable Abortion at 15.5 with PROM P:  Comfort care and pain medication options

## 2018-04-10 NOTE — Anesthesia Procedure Notes (Signed)
Procedure Name: Intubation Date/Time: 04/10/2018 9:46 PM Performed by: Sandrea Matte, CRNA Pre-anesthesia Checklist: Patient identified, Emergency Drugs available, Suction available and Patient being monitored Patient Re-evaluated:Patient Re-evaluated prior to induction Oxygen Delivery Method: Circle system utilized Preoxygenation: Pre-oxygenation with 100% oxygen Induction Type: Rapid sequence, Cricoid Pressure applied and IV induction Laryngoscope Size: Mac and 4 Grade View: Grade I Tube type: Oral Tube size: 7.0 mm Number of attempts: 1 Airway Equipment and Method: Stylet Placement Confirmation: ETT inserted through vocal cords under direct vision,  positive ETCO2 and breath sounds checked- equal and bilateral Secured at: 22 cm Tube secured with: Tape (_0 ) Dental Injury: Teeth and Oropharynx as per pre-operative assessment

## 2018-04-10 NOTE — Progress Notes (Signed)
Vaginal bleeding minimal. Notified Bernerd PhoNancy Prothero, CNM that placenta was still undelivered.

## 2018-04-11 ENCOUNTER — Encounter (HOSPITAL_COMMUNITY): Payer: Self-pay | Admitting: Obstetrics & Gynecology

## 2018-04-11 LAB — CBC
HCT: 28.1 % — ABNORMAL LOW (ref 36.0–46.0)
Hemoglobin: 9.1 g/dL — ABNORMAL LOW (ref 12.0–15.0)
MCH: 23.9 pg — ABNORMAL LOW (ref 26.0–34.0)
MCHC: 32.4 g/dL (ref 30.0–36.0)
MCV: 73.8 fL — ABNORMAL LOW (ref 80.0–100.0)
PLATELETS: 294 10*3/uL (ref 150–400)
RBC: 3.81 MIL/uL — AB (ref 3.87–5.11)
RDW: 16.4 % — AB (ref 11.5–15.5)
WBC: 18.6 10*3/uL — ABNORMAL HIGH (ref 4.0–10.5)
nRBC: 0 % (ref 0.0–0.2)

## 2018-04-11 MED ORDER — ACETAMINOPHEN 325 MG PO TABS: 650.0000 mg | ORAL_TABLET | ORAL | 0 refills | Status: DC | PRN

## 2018-04-11 MED ORDER — FERROUS SULFATE 325 (65 FE) MG PO TABS
325.0000 mg | ORAL_TABLET | Freq: Every day | ORAL | Status: DC
  Administered 2018-04-11: 325 mg via ORAL
  Filled 2018-04-11: qty 1

## 2018-04-11 MED ORDER — IBUPROFEN 600 MG PO TABS: 600.0000 mg | ORAL_TABLET | Freq: Four times a day (QID) | ORAL | 0 refills | Status: DC

## 2018-04-11 MED ORDER — FERROUS SULFATE 325 (65 FE) MG PO TABS: 325.0000 mg | ORAL_TABLET | Freq: Every day | ORAL | 3 refills | Status: AC

## 2018-04-11 NOTE — Addendum Note (Signed)
Addendum  created 04/11/18 0744 by Junious SilkGilbert, Aisley Whan, CRNA   Sign clinical note

## 2018-04-11 NOTE — Discharge Summary (Signed)
[facility]   Patient Name:   Megan [name] DOB:     [birth year] MRN:     [id number]  Date of Admission:   04/08/2018 Date of Discharge:  04/11/2018  Admitting diagnosis:    15.2WKS CRAMPING AND BLEEDING Active Problems:   Spontaneous abortion  SAB w/ Retained Placenta    Discharge diagnosis:    15.2WKS CRAMPING AND BLEEDING Active Problems:   Spontaneous abortion  s/p D&E                                                                     Post partum procedures: curettage   Delivering Provider: Kenney HousemanPROTHERO, NANCY JEAN   Date of Delivery:  04/10/2018     Newborn Data: 15 week demise  [facility]   [facility]  Hospital course:      Pt was admitted in active labor of 15 week demise, placenta retained w/ suction D&C done for removal - see OP note.  Recovered appropriately and is ready to go home.  Physical Exam:   Vitals:   04/10/18 2345 04/11/18 0100 04/11/18 0517 04/11/18 0801  BP: 106/65 100/65 113/60 114/70  Pulse: 81 83 73 71  Resp: 18 20 16 16   Temp: 97.9 F (36.6 C) 98.5 F (36.9 C) 98.4 F (36.9 C) 97.6 F (36.4 C)  TempSrc: Oral Oral Oral Oral  SpO2: 99% 98% 100% 100%  Weight:      Height:       General: alert, cooperative and no distress Lochia: appropriate Uterine Fundus: firm Incision: N/A DVT Evaluation: No evidence of DVT seen on physical exam.  Labs:  CBC Latest Ref Rng & Units 04/11/2018 04/10/2018 04/08/2018  WBC 4.0 - 10.5 K/uL 18.6(H) 12.2(H) 14.0(H)  Hemoglobin 12.0 - 15.0 g/dL 7.8(G9.1(L) 10.6(L) 11.8(L)  Hematocrit 36.0 - 46.0 % 28.1(L) 32.8(L) 36.4  Platelets 150 - 400 K/uL 294 345 350     Discharge instruction: per After Visit Summary and "Baby and Me Booklet".  After Visit Meds:  Allergies as of 04/11/2018   No Known Allergies     Medication List    TAKE these medications   acetaminophen 325 MG tablet Commonly known as:  TYLENOL Take 2 tablets (650 mg total) by mouth every 4 (four)  hours as needed (for pain scale < 4).   ferrous sulfate 325 (65 FE) MG tablet Take 1 tablet (325 mg total) by mouth daily with breakfast. Start taking on:  04/12/2018   ibuprofen 600 MG tablet Commonly known as:  ADVIL,MOTRIN Take 1 tablet (600 mg total) by mouth every 6 (six) hours.   PRENATAL VITAMINS PO Take by mouth.       Diet: routine diet  Activity: Advance as tolerated. Pelvic rest for 6 weeks.   Outpatient follow up: 1 week Follow up Appt:No future appointments. Follow up visit: No follow-ups on file.  Postpartum contraception: None  04/11/2018 Altamese CabalErin M Malaney Mcbean, CNM

## 2018-04-11 NOTE — Progress Notes (Signed)
Discharged home ambulatory in stable condition.

## 2018-04-11 NOTE — Anesthesia Postprocedure Evaluation (Signed)
Anesthesia Post Note  Patient: Sharlynn OliphantJasmine T Wixom  Procedure(s) Performed: DILATATION AND EVACUATION (N/A )     Patient location during evaluation: PACU Anesthesia Type: General Level of consciousness: awake and alert Pain management: pain level controlled Vital Signs Assessment: post-procedure vital signs reviewed and stable Respiratory status: spontaneous breathing, nonlabored ventilation, respiratory function stable and patient connected to nasal cannula oxygen Cardiovascular status: blood pressure returned to baseline and stable Postop Assessment: no apparent nausea or vomiting Anesthetic complications: no    Last Vitals:  Vitals:   04/10/18 2345 04/11/18 0100  BP: 106/65 100/65  Pulse: 81 83  Resp: 18 20  Temp: 36.6 C 36.9 C  SpO2: 99% 98%    Last Pain:  Vitals:   04/11/18 0100  TempSrc: Oral  PainSc: 0-No pain   Pain Goal: Patients Stated Pain Goal: 3 (04/10/18 2230)               Trevor IhaStephen A Houser

## 2018-04-11 NOTE — Anesthesia Postprocedure Evaluation (Signed)
Anesthesia Post Note  Patient: Sharlynn OliphantJasmine T Holmer  Procedure(s) Performed: DILATATION AND EVACUATION (N/A )     Patient location during evaluation: [facility] Anesthesia Type: General Level of consciousness: awake and alert Pain management: pain level controlled Vital Signs Assessment: post-procedure vital signs reviewed and stable Respiratory status: spontaneous breathing, nonlabored ventilation and respiratory function stable Cardiovascular status: blood pressure returned to baseline and stable Postop Assessment: no apparent nausea or vomiting Anesthetic complications: no    Last Vitals:  Vitals:   04/11/18 0100 04/11/18 0517  BP: 100/65 113/60  Pulse: 83 73  Resp: 20 16  Temp: 36.9 C 36.9 C  SpO2: 98% 100%    Last Pain:  Vitals:   04/11/18 0548  TempSrc:   PainSc: 0-No pain   Pain Goal: Patients Stated Pain Goal: 3 (04/10/18 2230)               Junious SilkGILBERT,Ermel Verne

## 2018-04-11 NOTE — Progress Notes (Signed)
Patient prepared for OR /for D&E as ordered by Dr. Charlotta Newtonzan.  Remained on NPO; consent signed. 2125 To OR per stretcher.

## 2019-05-25 ENCOUNTER — Telehealth (HOSPITAL_COMMUNITY): Payer: Self-pay | Admitting: *Deleted

## 2019-05-25 NOTE — Telephone Encounter (Signed)
Preadmission screen  

## 2019-05-27 ENCOUNTER — Other Ambulatory Visit (HOSPITAL_COMMUNITY)
Admission: RE | Admit: 2019-05-27 | Discharge: 2019-05-27 | Disposition: A | Discharge status: Home / Self Care | Payer: 59 | Source: Ambulatory Visit | Attending: Obstetrics and Gynecology | Admitting: Obstetrics and Gynecology

## 2019-05-27 NOTE — Progress Notes (Signed)
Attempted to contact pt x3 to reschedule covid test, as pt is too early.

## 2019-05-31 ENCOUNTER — Telehealth (HOSPITAL_COMMUNITY): Payer: Self-pay | Admitting: *Deleted

## 2019-05-31 NOTE — Telephone Encounter (Signed)
Preadmission screen  

## 2019-06-01 ENCOUNTER — Telehealth (HOSPITAL_COMMUNITY): Payer: Self-pay | Admitting: *Deleted

## 2019-06-01 NOTE — Telephone Encounter (Signed)
Preadmission screen Unable to reach voicemail is full.  Misty Stanley at [facility] notified and will assist in trying to reach pt and notify her of covid appt and NPO status and arrival time for OR

## 2019-06-04 ENCOUNTER — Other Ambulatory Visit (HOSPITAL_COMMUNITY): Payer: 59

## 2019-06-06 ENCOUNTER — Other Ambulatory Visit (HOSPITAL_COMMUNITY): Admission: RE | Admit: 2019-06-06 | Payer: No Typology Code available for payment source | Source: Ambulatory Visit

## 2019-06-08 ENCOUNTER — Ambulatory Visit (HOSPITAL_COMMUNITY)
Admission: RE | Admit: 2019-06-08 | Payer: No Typology Code available for payment source | Source: Home / Self Care | Admitting: Obstetrics

## 2019-06-08 ENCOUNTER — Encounter (HOSPITAL_COMMUNITY): Admission: RE | Payer: Self-pay | Source: Home / Self Care

## 2019-06-08 SURGERY — CERCLAGE, CERVIX, VAGINAL APPROACH
Anesthesia: Choice

## 2020-01-27 ENCOUNTER — Other Ambulatory Visit: Payer: No Typology Code available for payment source

## 2020-01-27 ENCOUNTER — Other Ambulatory Visit: Payer: Self-pay | Admitting: Critical Care Medicine

## 2020-01-27 DIAGNOSIS — Z20822 Contact with and (suspected) exposure to covid-19: Secondary | ICD-10-CM

## 2020-01-29 ENCOUNTER — Telehealth: Payer: Self-pay

## 2020-01-29 NOTE — Telephone Encounter (Signed)
Received call from patient checking Covid results.  Advised no results at this time.   

## 2020-01-31 LAB — NOVEL CORONAVIRUS, NAA: SARS-CoV-2, NAA: NOT DETECTED

## 2020-05-19 NOTE — L&D Delivery Note (Signed)
  [name], Megan [name] [[id number]]  Delivery Note At 4:51 AM a viable female was delivered via Vaginal, Spontaneous (Presentation:  cephalic    ).  APGAR: 6, 8; weight 12.7 oz (360 g).   Placenta status: Spontaneous;Pathology, Intact.  Cord: 3 vessels with the following complications:.    Please see Dr. Delray Alt note for details.  This baby delivered precipitously.  Upon arrival by Dr. Claiborne Billings, baby had delivered and Dr. Debroah Loop and NICU were in attendance.  Dr. Delray Alt previous progress note outlines the delivery.  Anesthesia: None Episiotomy: None Lacerations: None Suture Repair:  n/a Est. Blood Loss (mL): 1000   Baby to NICU.  [name] 10/31/2020, 10:55 AM     Megan [name], Megan [name] [[id number]]  Delivery Note Following delivery of baby A precipitously, patient was examined by Dr. Claiborne Billings.  See her note for details, but in summary baby B was found to be cephalic with reassuring fetal heart tracing.  Cerclage was removed.  Cervix was minimally dilated and baby was high in pelvis.  Magnesium sulfate was started.  She was painfully contracting and received fentanyl and then an epidural.  Bleeding was scant and she was afebrile.    When I assumed care, I reconfirmed cephalic presentation.  On my exam, patient was 6 cm dilated.  She had spontaneous rupture of membranes of baby B at 0841, at which time she was approximately 7-8 cm.  I was alerted by RN at 0900 of heavy vaginal bleeding.  I presented immediately to the room.  There was profuse bright red bleeding from the vagina.  Patient was examined and felt to be completely dilated.  NICU team was called and code hemorrhage was called.  She quickly delivered with 3 pushes.  Baby was transferred to NICU.  Pitocin and TXA were started given continued heavy vaginal bleeding.  After placenta removal, uterus contracted well and bleeding slowed appropriately.  VS remained normal.  A second IV was placed and cbc/DIC panel obtained.   QBL was around , but this was incomplete as two drapes were discarded prior to obtaining QBL.  She had at least a 100 mL EBL with delivery of baby A and was reported to have passed clots multiple times throughout the day yesterday.  Total EBL was estimated to be at least 1000 mL.  Given starting hemoglobin of 8.5 with PPH, I discussed with patient transfusion of 1U PRBCs vs expectant management. She and her partner elected to proceed with transfusion.  Consent was obtained.    At 9:05 AM a viable female was delivered via Vaginal, Spontaneous (Presentation:  cephalic    ).  APGAR: 4, 7; weight  440 gm.   Placenta status: Spontaneous;Pathology, Intact.  Cord: 3 vessels with the following complications: None.  Placentas were small, A smaller than B, even for gestational age.   Cord pH: to small to obtain pH or cord blood  Anesthesia: Epidural Episiotomy: None Lacerations: None Suture Repair:  n/a Est. Blood Loss (mL): 1000  Mom to postpartum.  Baby to NICU.  [name] 10/31/2020, 10:55 AM

## 2020-05-24 LAB — OB RESULTS CONSOLE HEPATITIS B SURFACE ANTIGEN: Hepatitis B Surface Ag: NEGATIVE

## 2020-05-24 LAB — OB RESULTS CONSOLE RUBELLA ANTIBODY, IGM: Rubella: IMMUNE

## 2020-05-24 LAB — OB RESULTS CONSOLE RPR: RPR: NONREACTIVE

## 2020-05-24 LAB — OB RESULTS CONSOLE HIV ANTIBODY (ROUTINE TESTING): HIV: NONREACTIVE

## 2020-08-09 ENCOUNTER — Other Ambulatory Visit: Payer: Self-pay | Admitting: Obstetrics

## 2020-08-09 DIAGNOSIS — Z363 Encounter for antenatal screening for malformations: Secondary | ICD-10-CM

## 2020-08-09 DIAGNOSIS — O3431 Maternal care for cervical incompetence, first trimester: Secondary | ICD-10-CM

## 2020-08-20 ENCOUNTER — Other Ambulatory Visit: Payer: Self-pay | Admitting: Obstetrics

## 2020-08-20 DIAGNOSIS — Z363 Encounter for antenatal screening for malformations: Secondary | ICD-10-CM

## 2020-08-20 DIAGNOSIS — O3431 Maternal care for cervical incompetence, first trimester: Secondary | ICD-10-CM

## 2020-08-21 ENCOUNTER — Encounter: Payer: Self-pay | Admitting: *Deleted

## 2020-08-23 ENCOUNTER — Ambulatory Visit: Payer: No Typology Code available for payment source | Admitting: *Deleted

## 2020-08-23 ENCOUNTER — Ambulatory Visit (HOSPITAL_BASED_OUTPATIENT_CLINIC_OR_DEPARTMENT_OTHER): Payer: No Typology Code available for payment source | Admitting: Maternal & Fetal Medicine

## 2020-08-23 ENCOUNTER — Ambulatory Visit: Payer: No Typology Code available for payment source | Attending: Obstetrics and Gynecology

## 2020-08-23 ENCOUNTER — Other Ambulatory Visit: Payer: Self-pay

## 2020-08-23 ENCOUNTER — Other Ambulatory Visit: Payer: Self-pay | Admitting: *Deleted

## 2020-08-23 ENCOUNTER — Encounter: Payer: Self-pay | Admitting: *Deleted

## 2020-08-23 VITALS — BP 119/68 | HR 93

## 2020-08-23 DIAGNOSIS — O30041 Twin pregnancy, dichorionic/diamniotic, first trimester: Secondary | ICD-10-CM | POA: Diagnosis present

## 2020-08-23 DIAGNOSIS — O3431 Maternal care for cervical incompetence, first trimester: Secondary | ICD-10-CM | POA: Insufficient documentation

## 2020-08-23 DIAGNOSIS — Z363 Encounter for antenatal screening for malformations: Secondary | ICD-10-CM | POA: Diagnosis present

## 2020-08-23 DIAGNOSIS — O09299 Supervision of pregnancy with other poor reproductive or obstetric history, unspecified trimester: Secondary | ICD-10-CM

## 2020-08-23 DIAGNOSIS — O30049 Twin pregnancy, dichorionic/diamniotic, unspecified trimester: Secondary | ICD-10-CM

## 2020-08-23 NOTE — Progress Notes (Signed)
MFM Consultation  Reason for request: History of cervical insufficiency now pregnancy with Diamniotic Dichorionic pregnancy. Date of Service: 08/23/20 Requesting provider: De Blanch, MD   Ms. Rymer is here for a first trimester anatomy with diamniotic dichorionic twin pregnancy. She is dated by LMP which is consistent with today's 11 week examination.  She is seen at the request of   Today we observed normal first trimester anatomy, however, I explained that a detailed anatomy is limited due to early gestation.  The nuchal translucency is normal for Twin A and B.  Ms. Pinette has a history of a preterm term delivery. She recalls presenting with new onset of bleeding and uterine contraction and subsequently delivered at [redacted] weeks gestation. Her next pregnancy ended in the first trimester just prior to a cerclage procedure. Her third pregnancy she was elective termination.  This pregnancy she was uncertain of her options for preterm birth prevention she should take and wanted to discuss the benefits and risk to cerclage placement in twins.   We discussed the outcomes of cerclage placement in twins when the CL was 1.5-2.4 cm and that there was a high risk of low birth weight infants.  Therefore, most collegues believe that cerclage placement is an individualized decision. After discussing the risk and benefits of cerclage placement She preferred a conservative approach of CL screening and considering a cerclage placement if the CL is < 1.5 cm.   She will also discuss nightly vaginal progesterone vs weekly IM progesterone with her providers. I recommended progesterone therapy beginning at 16-20 weeks until 36 weeks.   Finally, I discussed genetic screening options, she desired cell free DNA but was concerned about cost. She spoke with [place] financial services and opted to have this test drawn on Monday.  Ms. Gaines overall history is otherwise uncomplicated.  I spent 45 minutes with  > 50% in face to face consultation.  All questions answered.  I scheduled Ms Hyneman for serial CL beginning at 16 weeks and a detailed anatomy at 18-20 weeks.

## 2020-08-27 ENCOUNTER — Ambulatory Visit: Payer: No Typology Code available for payment source | Attending: Maternal & Fetal Medicine

## 2020-08-27 ENCOUNTER — Other Ambulatory Visit: Payer: Self-pay

## 2020-09-05 ENCOUNTER — Telehealth: Payer: Self-pay | Admitting: Genetic Counselor

## 2020-09-05 NOTE — Telephone Encounter (Signed)
LVM for Ms. Geister re: good news about screening results. Requested a call back to my direct line to discuss these in more detail, as no identifiers were provided in voicemail message.   Gershon Crane, MS, [facility] Genetic Counselor

## 2020-09-06 ENCOUNTER — Telehealth: Payer: Self-pay | Admitting: Genetic Counselor

## 2020-09-06 NOTE — Telephone Encounter (Signed)
I called Megan [name] to discuss her negative noninvasive prenatal screening (NIPS) result. Specifically, Megan [name] had Pan[facility]ama NIPS through [place]. These negative results demonstrated an expected representation of chromosome 21, 18, and 13 material, greatly reducing the likelihood of trisomies 21, 13, or 18 for the fetuses. The twins are predicted to be dizygotic (fraternal). Megan [name] requested to know about the expected fetal sexes; both fetuses are predicted to be female.  NIPS analyzes placental DNA in maternal circulation. NIPS is considered to be highly specific and sensitive, but is not considered to be a diagnostic test. This testing identifies 94-99% of pregnancies with trisomies 21, 13, and 18, but does not test for all genetic conditions. Diagnostic testing via amniocentesis is available any time after 16 weeks' gestation should Megan [name] be interested in confirming this result. She confirmed that she had no questions about these results at this time.  Gershon Crane, MS, [facility] Genetic Counselor

## 2020-09-21 ENCOUNTER — Other Ambulatory Visit: Payer: Self-pay

## 2020-09-21 ENCOUNTER — Other Ambulatory Visit: Payer: Self-pay | Admitting: Maternal & Fetal Medicine

## 2020-09-21 ENCOUNTER — Ambulatory Visit: Payer: No Typology Code available for payment source | Admitting: *Deleted

## 2020-09-21 ENCOUNTER — Inpatient Hospital Stay (HOSPITAL_COMMUNITY): Payer: No Typology Code available for payment source | Admitting: Anesthesiology

## 2020-09-21 ENCOUNTER — Other Ambulatory Visit: Payer: Self-pay | Admitting: *Deleted

## 2020-09-21 ENCOUNTER — Ambulatory Visit (HOSPITAL_BASED_OUTPATIENT_CLINIC_OR_DEPARTMENT_OTHER): Payer: No Typology Code available for payment source

## 2020-09-21 ENCOUNTER — Encounter (HOSPITAL_COMMUNITY)
Admission: AD | Disposition: A | Discharge status: Home / Self Care | Payer: Self-pay | Source: Home / Self Care | Attending: Obstetrics

## 2020-09-21 ENCOUNTER — Observation Stay (HOSPITAL_COMMUNITY): Payer: No Typology Code available for payment source

## 2020-09-21 ENCOUNTER — Encounter: Payer: Self-pay | Admitting: *Deleted

## 2020-09-21 ENCOUNTER — Inpatient Hospital Stay (HOSPITAL_COMMUNITY)
Admission: AD | Admit: 2020-09-21 | Payer: No Typology Code available for payment source | Source: Home / Self Care | Admitting: Obstetrics

## 2020-09-21 ENCOUNTER — Observation Stay (HOSPITAL_COMMUNITY)
Admission: AD | Admit: 2020-09-21 | Discharge: 2020-09-22 | Disposition: A | Discharge status: Home / Self Care | Payer: No Typology Code available for payment source | Attending: Obstetrics | Admitting: Obstetrics

## 2020-09-21 ENCOUNTER — Encounter (HOSPITAL_COMMUNITY): Payer: Self-pay | Admitting: Obstetrics

## 2020-09-21 VITALS — BP 116/72 | HR 85

## 2020-09-21 DIAGNOSIS — O30049 Twin pregnancy, dichorionic/diamniotic, unspecified trimester: Secondary | ICD-10-CM

## 2020-09-21 DIAGNOSIS — Z3A17 17 weeks gestation of pregnancy: Secondary | ICD-10-CM | POA: Insufficient documentation

## 2020-09-21 DIAGNOSIS — O3430 Maternal care for cervical incompetence, unspecified trimester: Secondary | ICD-10-CM

## 2020-09-21 DIAGNOSIS — O09212 Supervision of pregnancy with history of pre-term labor, second trimester: Secondary | ICD-10-CM | POA: Diagnosis not present

## 2020-09-21 DIAGNOSIS — O3432 Maternal care for cervical incompetence, second trimester: Secondary | ICD-10-CM

## 2020-09-21 DIAGNOSIS — O30042 Twin pregnancy, dichorionic/diamniotic, second trimester: Secondary | ICD-10-CM | POA: Insufficient documentation

## 2020-09-21 DIAGNOSIS — O0001 Abdominal pregnancy with intrauterine pregnancy: Secondary | ICD-10-CM | POA: Diagnosis not present

## 2020-09-21 DIAGNOSIS — O99212 Obesity complicating pregnancy, second trimester: Secondary | ICD-10-CM | POA: Diagnosis not present

## 2020-09-21 DIAGNOSIS — O322XX0 Maternal care for transverse and oblique lie, not applicable or unspecified: Secondary | ICD-10-CM

## 2020-09-21 DIAGNOSIS — E669 Obesity, unspecified: Secondary | ICD-10-CM

## 2020-09-21 DIAGNOSIS — Z20822 Contact with and (suspected) exposure to covid-19: Secondary | ICD-10-CM | POA: Diagnosis not present

## 2020-09-21 DIAGNOSIS — O26872 Cervical shortening, second trimester: Principal | ICD-10-CM | POA: Insufficient documentation

## 2020-09-21 HISTORY — PX: CERVICAL CERCLAGE: SHX1329

## 2020-09-21 LAB — RESP PANEL BY RT-PCR (FLU A&B, COVID) ARPGX2
Influenza A by PCR: NEGATIVE
Influenza B by PCR: NEGATIVE
SARS Coronavirus 2 by RT PCR: NEGATIVE

## 2020-09-21 LAB — CBC WITH DIFFERENTIAL/PLATELET
Abs Immature Granulocytes: 0.08 10*3/uL — ABNORMAL HIGH (ref 0.00–0.07)
Basophils Absolute: 0 10*3/uL (ref 0.0–0.1)
Basophils Relative: 0 %
Eosinophils Absolute: 0 10*3/uL (ref 0.0–0.5)
Eosinophils Relative: 0 %
HCT: 33 % — ABNORMAL LOW (ref 36.0–46.0)
Hemoglobin: 10.7 g/dL — ABNORMAL LOW (ref 12.0–15.0)
Immature Granulocytes: 1 %
Lymphocytes Relative: 14 %
Lymphs Abs: 1.7 10*3/uL (ref 0.7–4.0)
MCH: 23.7 pg — ABNORMAL LOW (ref 26.0–34.0)
MCHC: 32.4 g/dL (ref 30.0–36.0)
MCV: 73 fL — ABNORMAL LOW (ref 80.0–100.0)
Monocytes Absolute: 0.7 10*3/uL (ref 0.1–1.0)
Monocytes Relative: 6 %
Neutro Abs: 9 10*3/uL — ABNORMAL HIGH (ref 1.7–7.7)
Neutrophils Relative %: 79 %
Platelets: 396 10*3/uL (ref 150–400)
RBC: 4.52 MIL/uL (ref 3.87–5.11)
RDW: 16.7 % — ABNORMAL HIGH (ref 11.5–15.5)
WBC: 11.5 10*3/uL — ABNORMAL HIGH (ref 4.0–10.5)
nRBC: 0 % (ref 0.0–0.2)

## 2020-09-21 SURGERY — CERCLAGE, CERVIX, VAGINAL APPROACH
Anesthesia: Spinal

## 2020-09-21 MED ORDER — DIPHENHYDRAMINE HCL 50 MG/ML IJ SOLN
12.5000 mg | Freq: Once | INTRAMUSCULAR | Status: AC
  Administered 2020-09-21: 12.5 mg via INTRAVENOUS

## 2020-09-21 MED ORDER — PHENYLEPHRINE HCL (PRESSORS) 10 MG/ML IV SOLN
INTRAVENOUS | Status: DC | PRN
  Administered 2020-09-21: 80 ug via INTRAVENOUS

## 2020-09-21 MED ORDER — CHLOROPROCAINE HCL 1 % IJ SOLN: INTRAMUSCULAR | Status: DC | PRN

## 2020-09-21 MED ORDER — CEFAZOLIN SODIUM-DEXTROSE 2-4 GM/100ML-% IV SOLN
INTRAVENOUS | Status: AC
  Filled 2020-09-21: qty 100

## 2020-09-21 MED ORDER — FENTANYL CITRATE (PF) 100 MCG/2ML IJ SOLN: INTRAMUSCULAR | Status: DC | PRN

## 2020-09-21 MED ORDER — CHLOROPROCAINE HCL 50 MG/5ML IT SOLN: INTRATHECAL | Status: DC | PRN

## 2020-09-21 MED ORDER — CEFAZOLIN SODIUM-DEXTROSE 2-4 GM/100ML-% IV SOLN
2.0000 g | Freq: Once | INTRAVENOUS | Status: AC
  Administered 2020-09-21: 2 g via INTRAVENOUS

## 2020-09-21 MED ORDER — INDOMETHACIN 25 MG PO CAPS
50.0000 mg | ORAL_CAPSULE | Freq: Once | ORAL | Status: AC
  Administered 2020-09-21: 50 mg via ORAL
  Filled 2020-09-21 (×2): qty 2

## 2020-09-21 MED ORDER — DOCUSATE SODIUM 100 MG PO CAPS
100.0000 mg | ORAL_CAPSULE | Freq: Every day | ORAL | Status: DC
  Administered 2020-09-21: 100 mg via ORAL
  Filled 2020-09-21 (×2): qty 1

## 2020-09-21 MED ORDER — OXYCODONE HCL 5 MG/5ML PO SOLN: 5.0000 mg | Freq: Once | ORAL | Status: DC | PRN

## 2020-09-21 MED ORDER — ACETAMINOPHEN 325 MG PO TABS: 325.0000 mg | ORAL_TABLET | ORAL | Status: DC | PRN

## 2020-09-21 MED ORDER — ACETAMINOPHEN 325 MG PO TABS: 650.0000 mg | ORAL_TABLET | ORAL | Status: DC | PRN

## 2020-09-21 MED ORDER — INDOMETHACIN 25 MG PO CAPS
25.0000 mg | ORAL_CAPSULE | Freq: Four times a day (QID) | ORAL | Status: DC
  Administered 2020-09-21 – 2020-09-22 (×3): 25 mg via ORAL
  Filled 2020-09-21 (×4): qty 1

## 2020-09-21 MED ORDER — PROGESTERONE 200 MG PO CAPS
200.0000 mg | ORAL_CAPSULE | Freq: Every day | ORAL | Status: DC
  Administered 2020-09-21: 200 mg via VAGINAL
  Filled 2020-09-21: qty 1

## 2020-09-21 MED ORDER — FENTANYL CITRATE (PF) 100 MCG/2ML IJ SOLN
INTRAMUSCULAR | Status: DC | PRN
  Administered 2020-09-21: 25 ug via INTRATHECAL

## 2020-09-21 MED ORDER — CHLOROPROCAINE HCL 50 MG/5ML IT SOLN
INTRATHECAL | Status: DC | PRN
  Administered 2020-09-21: 35 mg via INTRATHECAL

## 2020-09-21 MED ORDER — MEPERIDINE HCL 25 MG/ML IJ SOLN: 6.2500 mg | INTRAMUSCULAR | Status: DC | PRN

## 2020-09-21 MED ORDER — STERILE WATER FOR IRRIGATION IR SOLN
Status: DC | PRN
  Administered 2020-09-21: 1

## 2020-09-21 MED ORDER — ONDANSETRON HCL 4 MG/2ML IJ SOLN: 4.0000 mg | Freq: Once | INTRAMUSCULAR | Status: DC | PRN

## 2020-09-21 MED ORDER — OXYCODONE HCL 5 MG PO TABS: 5.0000 mg | ORAL_TABLET | Freq: Once | ORAL | Status: DC | PRN

## 2020-09-21 MED ORDER — ONDANSETRON HCL 4 MG/2ML IJ SOLN
INTRAMUSCULAR | Status: DC | PRN
  Administered 2020-09-21: 4 mg via INTRAVENOUS

## 2020-09-21 MED ORDER — FENTANYL CITRATE (PF) 100 MCG/2ML IJ SOLN: 25.0000 ug | INTRAMUSCULAR | Status: DC | PRN

## 2020-09-21 MED ORDER — CALCIUM CARBONATE ANTACID 500 MG PO CHEW
2.0000 | CHEWABLE_TABLET | ORAL | Status: DC | PRN
  Filled 2020-09-21: qty 2

## 2020-09-21 MED ORDER — LACTATED RINGERS IV SOLN: INTRAVENOUS | Status: DC | PRN

## 2020-09-21 MED ORDER — STERILE WATER FOR IRRIGATION IR SOLN
Status: DC | PRN
  Administered 2020-09-21: 1000 mL

## 2020-09-21 MED ORDER — ACETAMINOPHEN 160 MG/5ML PO SOLN: 325.0000 mg | ORAL | Status: DC | PRN

## 2020-09-21 MED ORDER — PRENATAL MULTIVITAMIN CH
1.0000 | ORAL_TABLET | Freq: Every day | ORAL | Status: DC
  Administered 2020-09-21: 1 via ORAL
  Filled 2020-09-21 (×2): qty 1

## 2020-09-21 MED ORDER — ZOLPIDEM TARTRATE 5 MG PO TABS: 5.0000 mg | ORAL_TABLET | Freq: Every evening | ORAL | Status: DC | PRN

## 2020-09-21 MED ORDER — DIPHENHYDRAMINE HCL 50 MG/ML IJ SOLN
INTRAMUSCULAR | Status: AC
  Filled 2020-09-21: qty 1

## 2020-09-21 SURGICAL SUPPLY — 21 items
BLADE SURG 11 STRL SS (BLADE) ×2 IMPLANT
CANISTER SUCT 3000ML PPV (MISCELLANEOUS) ×2 IMPLANT
CATH FOLEY 2WAY SLVR 30CC 16FR (CATHETERS) IMPLANT
ELECT REM PT RETURN 9FT ADLT (ELECTROSURGICAL) ×2
ELECTRODE REM PT RTRN 9FT ADLT (ELECTROSURGICAL) ×1 IMPLANT
GLOVE BIO SURGEON STRL SZ7 (GLOVE) ×2 IMPLANT
GLOVE BIOGEL PI IND STRL 7.0 (GLOVE) ×1 IMPLANT
GLOVE BIOGEL PI INDICATOR 7.0 (GLOVE) ×1
GOWN STRL REUS W/TWL LRG LVL3 (GOWN DISPOSABLE) ×4 IMPLANT
NEEDLE MAYO CATGUT SZ4 (NEEDLE) ×2 IMPLANT
NS IRRIG 1000ML POUR BTL (IV SOLUTION) ×2 IMPLANT
PACK VAGINAL MINOR WOMEN LF (CUSTOM PROCEDURE TRAY) ×2 IMPLANT
PAD OB MATERNITY 4.3X12.25 (PERSONAL CARE ITEMS) ×2 IMPLANT
PAD PREP 24X48 CUFFED NSTRL (MISCELLANEOUS) ×2 IMPLANT
PENCIL BUTTON HOLSTER BLD 10FT (ELECTRODE) ×2 IMPLANT
SUT TICRON 2 BLUE 36 GS-21 (SUTURE) ×4 IMPLANT
SYR 30ML LL (SYRINGE) IMPLANT
TOWEL OR 17X24 6PK STRL BLUE (TOWEL DISPOSABLE) ×4 IMPLANT
TRAY FOLEY W/BAG SLVR 14FR (SET/KITS/TRAYS/PACK) ×2 IMPLANT
TUBING NON-CON 1/4 X 20 CONN (TUBING) ×2 IMPLANT
YANKAUER SUCT BULB TIP NO VENT (SUCTIONS) ×2 IMPLANT

## 2020-09-21 NOTE — Anesthesia Procedure Notes (Signed)
Spinal  Patient location during procedure: OR Start time: 09/21/2020 12:18 PM End time: 09/21/2020 12:25 PM Reason for block: surgical anesthesia Staffing Anesthesiologist: Bethena Midget, MD Preanesthetic Checklist Completed: patient identified, IV checked, site marked, risks and benefits discussed, surgical consent, monitors and equipment checked, pre-op evaluation and timeout performed Spinal Block Patient position: sitting Prep: DuraPrep Patient monitoring: heart rate, cardiac monitor, continuous pulse ox and blood pressure Approach: midline Location: L3-4 Injection technique: single-shot Needle Needle type: Sprotte  Needle gauge: 24 G Needle length: 9 cm Assessment Sensory level: T4 Events: CSF return

## 2020-09-21 NOTE — MAU Note (Signed)
.   Megan [name] is a 25 y.o. at [redacted]w[redacted]d here in MAU reporting: Sent for labs and possible cerclage placement. Denies VB or LOF. Hx of incompetent cervix.   Vitals:   09/21/20 0954 09/21/20 0955  BP: 130/71   Pulse: 99   Resp: 15   Temp: 98.6 F (37 C)   SpO2:  98%

## 2020-09-21 NOTE — Op Note (Signed)
Pre-Operative Diagnosis: 1. [redacted]w[redacted]d intrauterine pregnancy 2. Short cervix  3. Dichornionic diamniotic twin pregnancy  Postoperative Diagnosis: 1. [redacted]w[redacted]d intrauterine pregnancy 2. Short cervix  3. Dichornionic diamniotic twin pregnancy  Procedure: Ultrasound indicated [facility] Cerclage Surgeon: Marlow Baars, MD Assistant: Daisy Floro, MD Operative Findings: On intraoperative ultrasound, membranes funneled to the external os. On exam, short cervix, no visible membranes.  Following procedure, stitch visible in cervix and membranes above internal os Specimen: None EBL: Minimal   Operative procedure:  The patient was taken to the operating room where spinal anesthesia was placed.  After adequate anesthesia was achieved, the patient was placed in the dorsal lithotomy position.  She was prepped and draped in usual sterile fashion.  A foley catheter was placed in the bladder.  A speculum was placed in the vagina. Under ultrasound guidance, a foley catheter was placed through the cervix and inflated with 30 cc of saline to displace the membranes cephalad.  The cervix was grasped with fenestrated ring forceps.  The bladder reflection was identified.  A number 5 ethibond suture was used to make a purse string suture starting at 12:00 circumferentially.  The first knot was placed.  The foley balloon was deflated and removed. The suture was then tied down at 12:00 with 6 knots, an air knot, and 5 additional knots.  The suture was visible in the cervix on bedside ultrasound.  Fetal heart tones were identified with both fetuses and were normal.   Sponge, lap and needle count were correct x 2.  The patient tolerated the procedure well and was returned to the recovery room in stable condition.

## 2020-09-21 NOTE — Anesthesia Preprocedure Evaluation (Signed)
Anesthesia Evaluation  Patient identified by MRN, date of birth, ID band Patient awake    Reviewed: Allergy & Precautions, H&P , NPO status , Patient's Chart, lab work & pertinent test results, reviewed documented beta blocker date and time   Airway Mallampati: II  TM Distance: >3 FB Neck ROM: full    Dental no notable dental hx. (+) Teeth Intact, Dental Advisory Given   Pulmonary neg pulmonary ROS,    Pulmonary exam normal breath sounds clear to auscultation       Cardiovascular negative cardio ROS Normal cardiovascular exam Rhythm:regular Rate:Normal     Neuro/Psych negative neurological ROS  negative psych ROS   GI/Hepatic negative GI ROS, Neg liver ROS,   Endo/Other  negative endocrine ROS  Renal/GU negative Renal ROS  negative genitourinary   Musculoskeletal   Abdominal   Peds  Hematology  (+) Blood dyscrasia, anemia ,   Anesthesia Other Findings   Reproductive/Obstetrics (+) Pregnancy                             Anesthesia Physical Anesthesia Plan  ASA: II and emergent  Anesthesia Plan: Spinal   Post-op Pain Management:    Induction:   PONV Risk Score and Plan: Treatment may vary due to age or medical condition  Airway Management Planned: Natural Airway  Additional Equipment:   Intra-op Plan:   Post-operative Plan:   Informed Consent: I have reviewed the patients History and Physical, chart, labs and discussed the procedure including the risks, benefits and alternatives for the proposed anesthesia with the patient or authorized representative who has indicated his/her understanding and acceptance.       Plan Discussed with: Anesthesiologist and CRNA  Anesthesia Plan Comments:         Anesthesia Quick Evaluation

## 2020-09-21 NOTE — Transfer of Care (Signed)
Immediate Anesthesia Transfer of Care Note  Patient: Megan [name]  Procedure(s) Performed: CERCLAGE CERVICAL (N/A )  Patient Location: PACU  Anesthesia Type:Spinal  Level of Consciousness: awake, alert  and oriented  Airway & Oxygen Therapy: Patient Spontanous Breathing  Post-op Assessment: Report given to RN and Post -op Vital signs reviewed and stable  Post vital signs: Reviewed and stable  Last Vitals:  Vitals Value Taken Time  BP 98/60 09/21/20 1325  Temp    Pulse 92 09/21/20 1327  Resp 15 09/21/20 1327  SpO2 100 % 09/21/20 1327  Vitals shown include unvalidated device data.  Last Pain:  Vitals:   09/21/20 1131  TempSrc: Oral  PainSc: 0-No pain      Patients Stated Pain Goal: 4 (09/21/20 1131)  Complications: No complications documented.

## 2020-09-21 NOTE — H&P (Signed)
25 y.o. [id number] @ [redacted]w[redacted]d presents from MFM for consideration of a rescue cerclage.  She has a history significant for preterm delivery at 28 weeks.  With G2, she painless vaginal bleeding at 15 weeks and was found to be completely dilated. A prophylactic cerclage had been planned the week to follow.  This pregnancy is complicated by a dichorionic diamniotic twin pregnancy.  SHe was seen by MFM in the first trimester given her poor obstetrical history.   The decision was made to monitor cervical length, start progesterone therapy at 16 weeks (she is currently on vaginal progesterone) and consider a cerclage if the cervical length were < 1.5 cm.   On exam today, there is minimal residual cervical length, the cervix appeared dilated on ultrasound.  On physical exam by Dr. Grace Bushy she was fingertip dilated.  She is asymptomatic. She denies abdominal pain, contractions, bleeding, back pain.  Dr. Grace Bushy discussed options for continued management vs placement of rescue cerclage and she desires to proceed with cerclage placement.   .  Past Medical History:  Diagnosis Date  . Premature delivery   . Preterm labor     Past Surgical History:  Procedure Laterality Date  . DILATION AND EVACUATION N/A 04/10/2018   Procedure: DILATATION AND EVACUATION;  Surgeon: Myna Hidalgo, DO;  Location: [facility];  Service: Gynecology;  Laterality: N/A;  . INDUCED ABORTION    . WISDOM TOOTH EXTRACTION      OB History  Gravida Para Term Preterm AB Living  4 1 0 1 1 1   SAB IAB Ectopic Multiple Live Births  0 1 0 0 1    # Outcome Date GA Lbr Len/2nd Weight Sex Delivery Anes PTL Lv  4 Current           3 Gravida           2 IAB              Birth Comments: System Generated. Please review and update pregnancy details.  1 Preterm  [redacted]w[redacted]d    Vag-Spont   LIV    Social History   Socioeconomic History  . Marital status: Single    Spouse name: Not on file  . Number of children: Not on file  . Years of  education: Not on file  . Highest education level: Not on file  Occupational History  . Not on file  Tobacco Use  . Smoking status: Never Smoker  . Smokeless tobacco: Never Used  Vaping Use  . Vaping Use: Never used  Substance and Sexual Activity  . Alcohol use: Not Currently  . Drug use: No  . Sexual activity: Yes    Birth control/protection: None  Other Topics Concern  . Not on file  Social History Narrative  . Not on file   Social Determinants of Health   Financial Resource Strain: Not on file  Food Insecurity: Not on file  Transportation Needs: Not on file  Physical Activity: Not on file  Stress: Not on file  Social Connections: Not on file  Intimate Partner Violence: Not on file   Patient has no known allergies.     Vitals:   09/21/20 0954 09/21/20 0955  BP: 130/71   Pulse: 99   Resp: 15   Temp: 98.6 F (37 C)   SpO2:  98%     General:  NAD Abdomen:  soft, gravid, no fundal tenderness Ex:  no edema SVE:  FT per Dr. 11/21/20 without visible membranes FHTs:  A  144, B 152    A/P   25 y.o. [id number] [redacted]w[redacted]d presents for ultrasound indicated cerclage placement.  Again discussed with patient no intervention versus emergency ultrasound indicated cerclage. Risks of cerclage include but are not limited to bleeding, infection, rupture of membranes, inability to place the cerclage, damage to cervix with implications for current and future pregnancies, miscarriage, DVT/PE, and failure of cerclage if initially successful.  We discussed that her white blood cell count is mildly elevated and the small but potential risk of subclinical chorioamnionitis that has caused the cervical incompetence.  We discussed that should an overt infection occur, that delivery may be indicated.  She understands that delivery prior to 23 weeks would not be compatible with neonatal survival.  She has been NPO since yesterday.  She elects to proceed.   [facility] GEFFEL [employer]

## 2020-09-21 NOTE — Anesthesia Postprocedure Evaluation (Signed)
Anesthesia Post Note  Patient: Megan [name]  Procedure(s) Performed: CERCLAGE CERVICAL (N/A )     Patient location during evaluation: PACU Anesthesia Type: Spinal Level of consciousness: oriented and awake and alert Pain management: pain level controlled Vital Signs Assessment: post-procedure vital signs reviewed and stable Respiratory status: spontaneous breathing, respiratory function stable and patient connected to nasal cannula oxygen Cardiovascular status: blood pressure returned to baseline and stable Postop Assessment: no headache, no backache and no apparent nausea or vomiting Anesthetic complications: no   No complications documented.  Last Vitals:  Vitals:   09/21/20 1430 09/21/20 1438  BP: 105/68 (!) 113/57  Pulse: 71 72  Resp: 19 18  Temp:  36.6 C  SpO2: 100% 100%    Last Pain:  Vitals:   09/21/20 1438  TempSrc: Oral  PainSc: 0-No pain                 Kalep Full

## 2020-09-22 ENCOUNTER — Encounter (HOSPITAL_COMMUNITY): Payer: Self-pay | Admitting: Obstetrics

## 2020-09-22 MED ORDER — INDOMETHACIN 25 MG PO CAPS: 25.0000 mg | ORAL_CAPSULE | Freq: Four times a day (QID) | ORAL | 0 refills | Status: DC

## 2020-09-22 NOTE — Progress Notes (Signed)
Patient seen and examined.  C/o back pain at the site of the spinal.  Scant vaginal bleeding, mild cramping, but intermittent.    BP 113/61 (BP Location: Left Arm)   Pulse 79   Temp 97.8 F (36.6 C) (Oral)   Resp 17   Ht 4\' 11"  (1.499 m)   Wt 86.2 kg   LMP 05/22/2020   SpO2 100%   BMI 38.38 kg/m   Back--no bruising / hematoma seen Abdomen:  Soft, gravid, no fundal tenderness Pelvic:  Peripad without heme Ext:  No LE edema  A/P:  07/20/2020 @ [redacted]w[redacted]d POD#1 s/p ultrasound indicated cerclage Will discharge home today.  F/u in office on Monday Long discussion re: warning signs of infection Complete 48hr course of indocin per MFM To f/u w MFM for cervical length on Friday--this was not scheduled until 5/16--will contact mfm on Monday to have appt moved

## 2020-09-22 NOTE — Discharge Summary (Signed)
Postpartum Discharge Summary  Date of Service updated 09/22/20     Patient Name: Megan [name] DOB: Sep 11, 1995 MRN: [id number]  Date of admission: 09/21/2020 Delivery date:This patient has no babies on file. Delivering provider: This patient has no babies on file. Date of discharge: 09/22/2020  Admitting diagnosis: Short cervix during pregnancy in second trimester [O26.872] Intrauterine pregnancy: [redacted]w[redacted]d     Secondary diagnosis:  Active Problems:   Short cervix during pregnancy in second trimester  Additional problems: dichorionic diamniotic twin pregnancy    Discharge diagnosis: Short cervix                                                Hospital course: 25 y.o. [phone] @ [redacted]w[redacted]d with di-di twins who was seen at MFM for a transvaginal cervical length and was found to have a funnel to the external cervical os.   On physical exam by Dr. Grace Bushy, membranes were not visible.  She had no signs / symptoms of preterm labor or intrauterine infection.  She has a history significant for preterm delivery at 28 weeks.  With G2, she painless vaginal bleeding at 15 weeks and was found to be completely dilated. A prophylactic cerclage had been planned the week to follow. She was seen by MFM in the first trimester given her poor obstetrical history.   The decision was made to monitor cervical length, start progesterone therapy at 16 weeks (she is currently on vaginal progesterone) and consider a cerclage if the cervical length were < 1.5 cm.  Dr. Grace Bushy discussed options for continued management vs placement of rescue cerclage and she desires to proceed with cerclage placement. She underwent an ultrasound indicated cerclage.  She was started on a 48 hour course of indocin and was monitored overnight.  On POD#1 she had scant vaginal bleeding. Vital signs were normal (afebrile, no tachycardia) and there was no uterine tenderness.  She was discharged to home with strict precautions.  Will continue the nightly  prometrium and follow up with MFM in 1 week for repeat cervical length.   Physical exam  Vitals:   09/21/20 2053 09/22/20 0006 09/22/20 0606 09/22/20 0811  BP: (!) 110/59 (!) 109/49  113/61  Pulse: 90 79  79  Resp: 18 16 16 17   Temp: 98 F (36.7 C) 98 F (36.7 C) 98.1 F (36.7 C) 97.8 F (36.6 C)  TempSrc: Oral Oral Oral Oral  SpO2: 98% 100%  100%  Weight:      Height:       General: alert, cooperative and no distress Pelvic: No blood on peripad Uterine Fundus: soft, no fundal tenderness DVT Evaluation: No evidence of DVT seen on physical exam. Labs: Lab Results  Component Value Date   WBC 11.5 (H) 09/21/2020   HGB 10.7 (L) 09/21/2020   HCT 33.0 (L) 09/21/2020   MCV 73.0 (L) 09/21/2020   PLT 396 09/21/2020   CMP Latest Ref Rng & Units 02/07/2014  Glucose 70 - 99 mg/dL 02/09/2014)  BUN 6 - 23 mg/dL 11  Creatinine 503(T - 4.65 mg/dL 6.81  Sodium 2.75 - 170 mEq/L 139  Potassium 3.7 - 5.3 mEq/L 4.4  Chloride 96 - 112 mEq/L 102  CO2 19 - 32 mEq/L 23  Calcium 8.4 - 10.5 mg/dL 9.7  Total Protein 6.0 - 8.3 g/dL 8.2  Total Bilirubin 0.3 -  1.2 mg/dL 0.4  Alkaline Phos 39 - 117 U/L 74  AST 0 - 37 U/L 16  ALT 0 - 35 U/L 17     After visit meds:  Allergies as of 09/22/2020   No Known Allergies     Medication List    STOP taking these medications   ibuprofen 600 MG tablet Commonly known as: ADVIL     TAKE these medications   acetaminophen 325 MG tablet Commonly known as: Tylenol Take 2 tablets (650 mg total) by mouth every 4 (four) hours as needed (for pain scale < 4).   aspirin EC 81 MG tablet Take 81 mg by mouth daily. Swallow whole.   ferrous sulfate 325 (65 FE) MG tablet Take 1 tablet (325 mg total) by mouth daily with breakfast.   indomethacin 25 MG capsule Commonly known as: INDOCIN Take 1 capsule (25 mg total) by mouth every 6 (six) hours.   PRENATAL VITAMINS PO Take by mouth.   progesterone 200 MG Supp Place 200 mg vaginally at bedtime.          Future Appointments: Future Appointments  Date Time Provider Department Center  10/01/2020  3:30 PM [facility] NURSE [facility]  10/01/2020  3:45 PM [facility]  10/04/2020  8:00 AM [facility] NURSE [facility]  10/04/2020  9:00 AM [facility] US1 [facility]   Follow up Visit:  Follow-up Information    Ob/Gyn, [place] Follow up on 09/24/2020.   Why: f/u OB on 5/9 at 9:45 AM with Dr. Lurena Joiner information: [address] [place] [address] [phone]                   09/22/2020 [facility] Lizabeth Leyden, MD

## 2020-09-22 NOTE — Plan of Care (Signed)
  Problem: Education: ?Goal: Knowledge of General Education information will improve ?Description: Including pain rating scale, medication(s)/side effects and non-pharmacologic comfort measures ?Outcome: Completed/Met ?  ?Problem: Health Behavior/Discharge Planning: ?Goal: Ability to manage health-related needs will improve ?Outcome: Completed/Met ?  ?Problem: Clinical Measurements: ?Goal: Ability to maintain clinical measurements within normal limits will improve ?Outcome: Completed/Met ?Goal: Will remain free from infection ?Outcome: Completed/Met ?Goal: Diagnostic test results will improve ?Outcome: Completed/Met ?Goal: Respiratory complications will improve ?Outcome: Completed/Met ?Goal: Cardiovascular complication will be avoided ?Outcome: Completed/Met ?  ?Problem: Activity: ?Goal: Risk for activity intolerance will decrease ?Outcome: Completed/Met ?  ?Problem: Nutrition: ?Goal: Adequate nutrition will be maintained ?Outcome: Completed/Met ?  ?Problem: Coping: ?Goal: Level of anxiety will decrease ?Outcome: Completed/Met ?  ?Problem: Elimination: ?Goal: Will not experience complications related to bowel motility ?Outcome: Completed/Met ?Goal: Will not experience complications related to urinary retention ?Outcome: Completed/Met ?  ?Problem: Pain Managment: ?Goal: General experience of comfort will improve ?Outcome: Completed/Met ?  ?Problem: Safety: ?Goal: Ability to remain free from injury will improve ?Outcome: Completed/Met ?  ?Problem: Skin Integrity: ?Goal: Risk for impaired skin integrity will decrease ?Outcome: Completed/Met ?  ?Problem: Education: ?Goal: Knowledge of disease or condition will improve ?Outcome: Completed/Met ?Goal: Knowledge of the prescribed therapeutic regimen will improve ?Outcome: Completed/Met ?Goal: Individualized Educational Video(s) ?Outcome: Completed/Met ?  ?Problem: Clinical Measurements: ?Goal: Complications related to the disease process, condition or treatment will be  avoided or minimized ?Outcome: Completed/Met ?  ?

## 2020-10-01 ENCOUNTER — Ambulatory Visit: Payer: No Typology Code available for payment source | Attending: Maternal & Fetal Medicine

## 2020-10-01 ENCOUNTER — Other Ambulatory Visit: Payer: Self-pay

## 2020-10-01 ENCOUNTER — Ambulatory Visit: Payer: No Typology Code available for payment source | Admitting: *Deleted

## 2020-10-01 ENCOUNTER — Encounter: Payer: Self-pay | Admitting: *Deleted

## 2020-10-01 VITALS — BP 124/77 | HR 98

## 2020-10-01 DIAGNOSIS — O30042 Twin pregnancy, dichorionic/diamniotic, second trimester: Secondary | ICD-10-CM | POA: Diagnosis not present

## 2020-10-01 DIAGNOSIS — O99212 Obesity complicating pregnancy, second trimester: Secondary | ICD-10-CM

## 2020-10-01 DIAGNOSIS — O09213 Supervision of pregnancy with history of pre-term labor, third trimester: Secondary | ICD-10-CM

## 2020-10-01 DIAGNOSIS — O321XX1 Maternal care for breech presentation, fetus 1: Secondary | ICD-10-CM

## 2020-10-01 DIAGNOSIS — O3430 Maternal care for cervical incompetence, unspecified trimester: Secondary | ICD-10-CM | POA: Insufficient documentation

## 2020-10-01 DIAGNOSIS — O3432 Maternal care for cervical incompetence, second trimester: Secondary | ICD-10-CM | POA: Diagnosis not present

## 2020-10-01 DIAGNOSIS — O321XX2 Maternal care for breech presentation, fetus 2: Secondary | ICD-10-CM

## 2020-10-01 DIAGNOSIS — E669 Obesity, unspecified: Secondary | ICD-10-CM

## 2020-10-01 DIAGNOSIS — Z3A18 18 weeks gestation of pregnancy: Secondary | ICD-10-CM

## 2020-10-04 ENCOUNTER — Encounter: Payer: Self-pay | Admitting: *Deleted

## 2020-10-04 ENCOUNTER — Other Ambulatory Visit: Payer: Self-pay

## 2020-10-04 ENCOUNTER — Ambulatory Visit
Payer: No Typology Code available for payment source | Attending: Maternal & Fetal Medicine | Admitting: Maternal & Fetal Medicine

## 2020-10-04 ENCOUNTER — Ambulatory Visit: Payer: No Typology Code available for payment source | Admitting: *Deleted

## 2020-10-04 ENCOUNTER — Ambulatory Visit: Payer: No Typology Code available for payment source | Attending: Maternal & Fetal Medicine

## 2020-10-04 VITALS — BP 110/69 | HR 87

## 2020-10-04 DIAGNOSIS — O365990 Maternal care for other known or suspected poor fetal growth, unspecified trimester, not applicable or unspecified: Secondary | ICD-10-CM

## 2020-10-04 DIAGNOSIS — O3432 Maternal care for cervical incompetence, second trimester: Secondary | ICD-10-CM

## 2020-10-04 DIAGNOSIS — O09212 Supervision of pregnancy with history of pre-term labor, second trimester: Secondary | ICD-10-CM | POA: Diagnosis not present

## 2020-10-04 DIAGNOSIS — O30049 Twin pregnancy, dichorionic/diamniotic, unspecified trimester: Secondary | ICD-10-CM | POA: Insufficient documentation

## 2020-10-04 DIAGNOSIS — O30042 Twin pregnancy, dichorionic/diamniotic, second trimester: Secondary | ICD-10-CM | POA: Diagnosis present

## 2020-10-04 DIAGNOSIS — O99212 Obesity complicating pregnancy, second trimester: Secondary | ICD-10-CM

## 2020-10-04 DIAGNOSIS — E669 Obesity, unspecified: Secondary | ICD-10-CM

## 2020-10-04 DIAGNOSIS — Z3A19 19 weeks gestation of pregnancy: Secondary | ICD-10-CM

## 2020-10-05 ENCOUNTER — Other Ambulatory Visit: Payer: Self-pay | Admitting: *Deleted

## 2020-10-05 DIAGNOSIS — O30049 Twin pregnancy, dichorionic/diamniotic, unspecified trimester: Secondary | ICD-10-CM

## 2020-10-05 NOTE — Progress Notes (Signed)
Megan [name] is a 25 yo G4P1 here with diamniotic dichorionic twins and being seen for detailed exam with known cervical insufficiency and cerclage + vag prog.   Normal anatomy with good amniotic fluid and fetal movement was observed in Twin A and B.  Howevere, we observed fetal growth restriction in Twin A (EFW 6th%) Twin B (EFW 8th%)   I reviewed the normal nature of today's ultrasound. Megan [name] and encourage her to take low dose aspirin for preeclampsia prevention.  We reviewed the sonographic findings and limitations of ultrasound. The potential risks associated with a twin gestation were discussed.  This discussion included a review of the increased risk of miscarriages, anomalies, preterm labor, and/or delivery, malpresentation, delivery via cesarean section, gestational diabetes, and/or preeclampsia.  With regards to fetal risks, there is an increased risk for fetal growth restriction of one or both twins, preterm labor, and associated morbidity, and intrauterine fetal demise.    We also discuss the management of fetal growth restriction. I explained that the etiology includes placental insufficiency, chronic disease, infection, aneuploidy and other genetic syndromes. She has a low risk NIPS. She has no additional risk factors for chronic disease. At this time I explained the diagnosis, evaluation and management to include on going fetal growth and weekly antenatal testing to include UA Dopplers. If the EFW < 3rd% or abnormal testing, I recommend delivery at 37 weeks otherwise if all is normal consider delivery at 39 weeks.   Lastly, Megan [name] is being managed with a ultrasound indicated cerclage. The cervix is short and the cerclage is in place. Megan [name] denies s/sx of uterine contractions or pressure.  We recommend growth scans every 4 weeks starting at 24 weeks with the initation of weekly antenatal testing in the form of twice weekly NST or weekly BPP should abnormal fetal growth or  intertwin discordance of greater than 20-25% is noted.   We scheduled her to return for growth in 4 weeks and UA Dopplers performed in 3 weeks.   Following counseling, all questions were addressed.    I spent 30 minutes with > 50% in face to face consultation  Megan Olive, MD

## 2020-10-08 ENCOUNTER — Other Ambulatory Visit: Payer: Self-pay

## 2020-10-22 ENCOUNTER — Inpatient Hospital Stay (EMERGENCY_DEPARTMENT_HOSPITAL)
Admission: AD | Admit: 2020-10-22 | Discharge: 2020-10-23 | Disposition: A | Discharge status: Home / Self Care | Payer: No Typology Code available for payment source | Source: Home / Self Care | Attending: Obstetrics and Gynecology | Admitting: Obstetrics and Gynecology

## 2020-10-22 ENCOUNTER — Other Ambulatory Visit: Payer: Self-pay

## 2020-10-22 DIAGNOSIS — Z7982 Long term (current) use of aspirin: Secondary | ICD-10-CM | POA: Insufficient documentation

## 2020-10-22 DIAGNOSIS — Z3492 Encounter for supervision of normal pregnancy, unspecified, second trimester: Secondary | ICD-10-CM

## 2020-10-22 DIAGNOSIS — O209 Hemorrhage in early pregnancy, unspecified: Secondary | ICD-10-CM | POA: Insufficient documentation

## 2020-10-22 DIAGNOSIS — O09292 Supervision of pregnancy with other poor reproductive or obstetric history, second trimester: Secondary | ICD-10-CM | POA: Insufficient documentation

## 2020-10-22 DIAGNOSIS — Z79890 Hormone replacement therapy (postmenopausal): Secondary | ICD-10-CM | POA: Insufficient documentation

## 2020-10-22 DIAGNOSIS — O09212 Supervision of pregnancy with history of pre-term labor, second trimester: Secondary | ICD-10-CM | POA: Insufficient documentation

## 2020-10-22 DIAGNOSIS — Z3A22 22 weeks gestation of pregnancy: Secondary | ICD-10-CM | POA: Insufficient documentation

## 2020-10-22 NOTE — MAU Note (Signed)
PT SAYS SPOTTING NOW-  BUT WAS SOAKING . FEELS UC'S SAW CLARK-ON 6-1.   HAS CIRCLAGE

## 2020-10-23 DIAGNOSIS — Z3A22 22 weeks gestation of pregnancy: Secondary | ICD-10-CM

## 2020-10-23 DIAGNOSIS — Z0371 Encounter for suspected problem with amniotic cavity and membrane ruled out: Secondary | ICD-10-CM

## 2020-10-23 LAB — WET PREP, GENITAL
Clue Cells Wet Prep HPF POC: NONE SEEN
Sperm: NONE SEEN
Trich, Wet Prep: NONE SEEN
Yeast Wet Prep HPF POC: NONE SEEN

## 2020-10-23 LAB — AMNISURE RUPTURE OF MEMBRANE (ROM) NOT AT ARMC: Amnisure ROM: NEGATIVE

## 2020-10-23 NOTE — Discharge Instructions (Signed)
Preterm Labor The normal length of a pregnancy is 39-41 weeks. Preterm labor is when labor starts before 37 completed weeks of pregnancy. Babies who are born prematurely and survive may not be fully developed and may be at an increased risk for long-term problems such as cerebral palsy, developmental delays, and vision and hearing problems. Babies who are born too early may have problems soon after birth. Problems may include regulating blood sugar, body temperature, heart rate, and breathing rate. These babies often have trouble with feeding. The risk of having problems is highest for babies who are born before 34 weeks of pregnancy. What are the causes? The exact cause of this condition is not known. What increases the risk? You are more likely to have preterm labor if you have certain risk factors that relate to your medical history, problems with present and past pregnancies, and lifestyle factors. Medical history  You have abnormalities of the uterus, including a short cervix.  You have STIs (sexually transmitted infections), or other infections of the urinary tract and the vagina.  You have chronic illnesses, such as blood clotting problems, diabetes, or high blood pressure.  You are overweight or underweight. Present and past pregnancies  You have had preterm labor before.  You are pregnant with twins or other multiples.  You have been diagnosed with a condition in which the placenta covers your cervix (placenta previa).  You waited less than 6 months between giving birth and becoming pregnant again.  Your unborn baby has some abnormalities.  You have vaginal bleeding during pregnancy.  You became pregnant through in vitro fertilization (IVF). Lifestyle and environmental factors  You use tobacco products.  You drink alcohol.  You use street drugs.  You have stress and no social support.  You experience domestic violence.  You are exposed to certain chemicals or  environmental pollutants. Other factors  You are younger than age 17 or older than age 35. What are the signs or symptoms? Symptoms of this condition include:  Cramps similar to those that can happen during a menstrual period. The cramps may happen with diarrhea.  Pain in the abdomen or lower back.  Regular contractions that may feel like tightening of the abdomen.  A feeling of increased pressure in the pelvis.  Increased watery or bloody mucus discharge from the vagina.  Water breaking (ruptured amniotic sac). How is this diagnosed? This condition is diagnosed based on:  Your medical history and a physical exam.  A pelvic exam.  An ultrasound.  Monitoring your uterus for contractions.  Other tests, including: ? A swab of the cervix to check for a chemical called fetal fibronectin. ? Urine tests. How is this treated? Treatment for this condition depends on the length of your pregnancy, your condition, and the health of your baby. Treatment may include:  Taking medicines, such as: ? Hormone medicines. These may be given early in pregnancy to help support the pregnancy. ? Medicines to stop contractions. ? Medicines to help mature the baby's lungs. These may be prescribed if the risk of delivery is high. ? Medicines to prevent your baby from developing cerebral palsy.  Bed rest. If the labor happens before 34 weeks of pregnancy, you may need to stay in the hospital.  Delivery of the baby. Follow these instructions at home:  Do not use any products that contain nicotine or tobacco, such as cigarettes, e-cigarettes, and chewing tobacco. If you need help quitting, ask your health care provider.  Do not drink alcohol.    Take over-the-counter and prescription medicines only as told by your health care provider.  Rest as told by your health care provider.  Return to your normal activities as told by your health care provider. Ask your health care provider what activities  are safe for you.  Keep all follow-up visits as told by your health care provider. This is important.   How is this prevented? To increase your chance of having a full-term pregnancy:  Do not use street drugs or medicines that have not been prescribed to you during your pregnancy.  Talk with your health care provider before taking any herbal supplements, even if you have been taking them regularly.  Make sure you gain a healthy amount of weight during your pregnancy.  Watch for infection. If you think that you might have an infection, get it checked right away. Symptoms of infection may include: ? Fever. ? Abnormal vaginal discharge or discharge that smells bad. ? Pain or burning with urination. ? Needing to urinate urgently. ? Frequently urinating or passing small amounts of urine frequently. ? Blood in your urine. ? Urine that smells bad or unusual.  Tell your health care provider if you have had preterm labor before. Contact a health care provider if:  You think you are going into preterm labor.  You have signs or symptoms of preterm labor.  You have symptoms of infection. Get help right away if:  You are having regular, painful contractions every 5 minutes or less.  Your water breaks. Summary  Preterm labor is labor that starts before you reach 37 weeks of pregnancy.  Delivering your baby early increases your baby's risk of developing lifelong problems.  The exact cause of preterm labor is unknown. However, having an abnormal uterus, an STI (sexually transmitted infection), or vaginal bleeding during pregnancy increases your risk for preterm labor.  Keep all follow-up visits as told by your health care provider. This is important.  Contact a health care provider if you have signs or symptoms of preterm labor. This information is not intended to replace advice given to you by your health care provider. Make sure you discuss any questions you have with your health care  provider. Document Revised: 06/07/2019 Document Reviewed: 06/07/2019 Elsevier Patient Education  2021 [employer]vier Inc[employer]  

## 2020-10-24 ENCOUNTER — Inpatient Hospital Stay (HOSPITAL_COMMUNITY)
Admission: AD | Admit: 2020-10-24 | Discharge: 2020-11-01 | DRG: 768 | Disposition: A | Discharge status: Home / Self Care | Payer: No Typology Code available for payment source | Attending: Obstetrics and Gynecology | Admitting: Obstetrics and Gynecology

## 2020-10-24 ENCOUNTER — Encounter (HOSPITAL_COMMUNITY): Payer: Self-pay | Admitting: Obstetrics and Gynecology

## 2020-10-24 ENCOUNTER — Other Ambulatory Visit: Payer: Self-pay

## 2020-10-24 ENCOUNTER — Inpatient Hospital Stay (HOSPITAL_BASED_OUTPATIENT_CLINIC_OR_DEPARTMENT_OTHER): Payer: No Typology Code available for payment source

## 2020-10-24 DIAGNOSIS — O42919 Preterm premature rupture of membranes, unspecified as to length of time between rupture and onset of labor, unspecified trimester: Secondary | ICD-10-CM | POA: Diagnosis present

## 2020-10-24 DIAGNOSIS — O321XX2 Maternal care for breech presentation, fetus 2: Secondary | ICD-10-CM | POA: Diagnosis present

## 2020-10-24 DIAGNOSIS — Z0371 Encounter for suspected problem with amniotic cavity and membrane ruled out: Secondary | ICD-10-CM | POA: Diagnosis not present

## 2020-10-24 DIAGNOSIS — Z23 Encounter for immunization: Secondary | ICD-10-CM

## 2020-10-24 DIAGNOSIS — O4592 Premature separation of placenta, unspecified, second trimester: Secondary | ICD-10-CM | POA: Diagnosis present

## 2020-10-24 DIAGNOSIS — Z20822 Contact with and (suspected) exposure to covid-19: Secondary | ICD-10-CM | POA: Diagnosis present

## 2020-10-24 DIAGNOSIS — O4102X0 Oligohydramnios, second trimester, not applicable or unspecified: Secondary | ICD-10-CM | POA: Diagnosis present

## 2020-10-24 DIAGNOSIS — O9902 Anemia complicating childbirth: Secondary | ICD-10-CM | POA: Diagnosis present

## 2020-10-24 DIAGNOSIS — Z3A23 23 weeks gestation of pregnancy: Secondary | ICD-10-CM | POA: Diagnosis not present

## 2020-10-24 DIAGNOSIS — O30042 Twin pregnancy, dichorionic/diamniotic, second trimester: Secondary | ICD-10-CM | POA: Diagnosis present

## 2020-10-24 DIAGNOSIS — O26872 Cervical shortening, second trimester: Secondary | ICD-10-CM | POA: Diagnosis present

## 2020-10-24 DIAGNOSIS — O99214 Obesity complicating childbirth: Secondary | ICD-10-CM | POA: Diagnosis present

## 2020-10-24 DIAGNOSIS — D509 Iron deficiency anemia, unspecified: Secondary | ICD-10-CM | POA: Diagnosis present

## 2020-10-24 DIAGNOSIS — O365921 Maternal care for other known or suspected poor fetal growth, second trimester, fetus 1: Secondary | ICD-10-CM | POA: Diagnosis present

## 2020-10-24 DIAGNOSIS — O3432 Maternal care for cervical incompetence, second trimester: Secondary | ICD-10-CM | POA: Diagnosis present

## 2020-10-24 DIAGNOSIS — O09212 Supervision of pregnancy with history of pre-term labor, second trimester: Secondary | ICD-10-CM | POA: Diagnosis not present

## 2020-10-24 DIAGNOSIS — O4290 Premature rupture of membranes, unspecified as to length of time between rupture and onset of labor, unspecified weeks of gestation: Secondary | ICD-10-CM

## 2020-10-24 DIAGNOSIS — O42912 Preterm premature rupture of membranes, unspecified as to length of time between rupture and onset of labor, second trimester: Secondary | ICD-10-CM | POA: Diagnosis present

## 2020-10-24 DIAGNOSIS — O365990 Maternal care for other known or suspected poor fetal growth, unspecified trimester, not applicable or unspecified: Secondary | ICD-10-CM

## 2020-10-24 DIAGNOSIS — Z3A22 22 weeks gestation of pregnancy: Secondary | ICD-10-CM | POA: Diagnosis not present

## 2020-10-24 DIAGNOSIS — O365922 Maternal care for other known or suspected poor fetal growth, second trimester, fetus 2: Secondary | ICD-10-CM | POA: Diagnosis present

## 2020-10-24 LAB — CBC
HCT: 31 % — ABNORMAL LOW (ref 36.0–46.0)
Hemoglobin: 9.7 g/dL — ABNORMAL LOW (ref 12.0–15.0)
MCH: 22.9 pg — ABNORMAL LOW (ref 26.0–34.0)
MCHC: 31.3 g/dL (ref 30.0–36.0)
MCV: 73.3 fL — ABNORMAL LOW (ref 80.0–100.0)
Platelets: 390 10*3/uL (ref 150–400)
RBC: 4.23 MIL/uL (ref 3.87–5.11)
RDW: 16.5 % — ABNORMAL HIGH (ref 11.5–15.5)
WBC: 11.6 10*3/uL — ABNORMAL HIGH (ref 4.0–10.5)
nRBC: 0 % (ref 0.0–0.2)

## 2020-10-24 LAB — TYPE AND SCREEN
ABO/RH(D): AB POS
Antibody Screen: NEGATIVE

## 2020-10-24 LAB — RAPID HIV SCREEN (HIV 1/2 AB+AG)
HIV 1/2 Antibodies: NONREACTIVE
HIV-1 P24 Antigen - HIV24: NONREACTIVE

## 2020-10-24 LAB — POCT FERN TEST: POCT Fern Test: POSITIVE

## 2020-10-24 LAB — RESP PANEL BY RT-PCR (FLU A&B, COVID) ARPGX2
Influenza A by PCR: NEGATIVE
Influenza B by PCR: NEGATIVE
SARS Coronavirus 2 by RT PCR: NEGATIVE

## 2020-10-24 LAB — GROUP B STREP BY PCR: Group B strep by PCR: NEGATIVE

## 2020-10-24 MED ORDER — AMOXICILLIN 500 MG PO CAPS
500.0000 mg | ORAL_CAPSULE | Freq: Three times a day (TID) | ORAL | Status: DC
  Administered 2020-10-26 – 2020-10-30 (×14): 500 mg via ORAL
  Filled 2020-10-24 (×15): qty 1

## 2020-10-24 MED ORDER — SODIUM CHLORIDE 0.9 % IV SOLN
250.0000 mg | Freq: Four times a day (QID) | INTRAVENOUS | Status: AC
  Administered 2020-10-24 – 2020-10-26 (×8): 250 mg via INTRAVENOUS
  Filled 2020-10-24 (×8): qty 5

## 2020-10-24 MED ORDER — LACTATED RINGERS IV SOLN: INTRAVENOUS | Status: DC

## 2020-10-24 MED ORDER — BETAMETHASONE SOD PHOS & ACET 6 (3-3) MG/ML IJ SUSP
12.0000 mg | INTRAMUSCULAR | Status: AC
  Administered 2020-10-28 – 2020-10-29 (×2): 12 mg via INTRAMUSCULAR
  Filled 2020-10-24: qty 5

## 2020-10-24 MED ORDER — PRENATAL MULTIVITAMIN CH
1.0000 | ORAL_TABLET | Freq: Every day | ORAL | Status: DC
  Administered 2020-10-24 – 2020-10-30 (×7): 1 via ORAL
  Filled 2020-10-24 (×7): qty 1

## 2020-10-24 MED ORDER — DOCUSATE SODIUM 100 MG PO CAPS
100.0000 mg | ORAL_CAPSULE | Freq: Every day | ORAL | Status: DC
  Administered 2020-10-24 – 2020-10-30 (×7): 100 mg via ORAL
  Filled 2020-10-24 (×7): qty 1

## 2020-10-24 MED ORDER — ERYTHROMYCIN BASE 250 MG PO TABS
250.0000 mg | ORAL_TABLET | Freq: Three times a day (TID) | ORAL | Status: DC
  Administered 2020-10-26 – 2020-10-30 (×14): 250 mg via ORAL
  Filled 2020-10-24 (×15): qty 1

## 2020-10-24 MED ORDER — CALCIUM CARBONATE ANTACID 500 MG PO CHEW: 2.0000 | CHEWABLE_TABLET | ORAL | Status: DC | PRN

## 2020-10-24 MED ORDER — SODIUM CHLORIDE 0.9 % IV SOLN
2.0000 g | Freq: Four times a day (QID) | INTRAVENOUS | Status: AC
  Administered 2020-10-24 – 2020-10-26 (×8): 2 g via INTRAVENOUS
  Filled 2020-10-24 (×8): qty 2000

## 2020-10-24 MED ORDER — ACETAMINOPHEN 325 MG PO TABS
650.0000 mg | ORAL_TABLET | ORAL | Status: DC | PRN
  Administered 2020-10-24 – 2020-10-30 (×18): 650 mg via ORAL
  Filled 2020-10-24 (×19): qty 2

## 2020-10-24 MED ORDER — ZOLPIDEM TARTRATE 5 MG PO TABS: 5.0000 mg | ORAL_TABLET | Freq: Every evening | ORAL | Status: DC | PRN

## 2020-10-24 NOTE — H&P (Signed)
25 y.o. [redacted]w[redacted]d  [id number] comes in reporting gush of clear fluid this morning in setting of didi twin pregnancy with cerclage in place for short cervix. Otherwise has good fetal movement and no bleeding.  Denies bleeding, denies contractions, fever, abdominal pain or other complaints.  Past Medical History:  Diagnosis Date  . Premature delivery   . Preterm labor     Past Surgical History:  Procedure Laterality Date  . CERVICAL CERCLAGE N/A 09/21/2020   Procedure: CERCLAGE CERVICAL;  Surgeon: Marlow Baars, MD;  Location: [facility];  Service: Gynecology;  Laterality: N/A;  . DILATION AND EVACUATION N/A 04/10/2018   Procedure: DILATATION AND EVACUATION;  Surgeon: Myna Hidalgo, DO;  Location: [facility];  Service: Gynecology;  Laterality: N/A;  . INDUCED ABORTION    . WISDOM TOOTH EXTRACTION      OB History  Gravida Para Term Preterm AB Living  4 1 0 1 2 1   SAB IAB Ectopic Multiple Live Births  1 1 0 0 1    # Outcome Date GA Lbr Len/2nd Weight Sex Delivery Anes PTL Lv  4 Current           3 SAB           2 IAB              Birth Comments: System Generated. Please review and update pregnancy details.  1 Preterm  [redacted]w[redacted]d    Vag-Spont   LIV    Social History   Socioeconomic History  . Marital status: Single    Spouse name: Not on file  . Number of children: Not on file  . Years of education: Not on file  . Highest education level: Not on file  Occupational History  . Not on file  Tobacco Use  . Smoking status: Never Smoker  . Smokeless tobacco: Never Used  Vaping Use  . Vaping Use: Never used  Substance and Sexual Activity  . Alcohol use: Not Currently  . Drug use: No  . Sexual activity: Yes    Birth control/protection: None  Other Topics Concern  . Not on file  Social History Narrative  . Not on file   Social Determinants of Health   Financial Resource Strain: Not on file  Food Insecurity: Not on file  Transportation Needs: Not on file  Physical Activity: Not  on file  Stress: Not on file  Social Connections: Not on file  Intimate Partner Violence: Not on file   Patient has no known allergies.    Other PNC: h/o 28 weeks delivery, this pregnancy 17week cerclage placed for short cervix    Vitals:   10/24/20 1034  BP: 116/70  Pulse: 88  Resp: 18  Temp: 98.2 F (36.8 C)  TempSrc: Oral  SpO2: 97%  Weight: 85.7 kg  Height: 4\' 11"  (1.499 m)    Lungs/Cor:  NAD Abdomen:  soft, gravid Ex:  no cords, erythema SSE: visually closed with cerclage knot visible FHTs: confirmed present by 12/24/20 x2 Toco:  quiet   A/P   Admit at 22.1 didi pregnancy with cerclage and PPROM Latency abx ordered Betamethasone ordered to start at 22.5, consider tocolysis with indomethcin when starting beta course per MFM MgSO4 only if reaching viability and delivery imminent in 24 hrs [place] position today bedside Baby A oblique with low fluid, Baby B breech Formal [id number] ordered for position, AFI and growth NICU consult placed, discussed case with Dr. [place], he will speak with patient, he advised  no resuscitation/ intervention until threshold 23.0 MFM consult placed. Dr. Grace Bushy will see, discussed and agrees with above plan GC/CT/GBS/HIV/RPR ordered FHT daily until viability Cont. toco  GBS pending  Philip Aspen

## 2020-10-24 NOTE — Consult Note (Signed)
Perinatal Medicine Consult  10/24/2020 2:44 PM  Megan [name] [id number]    Background I was asked by Dr. Claiborne Billings to consult on this patient for possible periviable preterm delivery.  I had the pleasure of meeting with Megan [name] today.  The babies' father was also on the phone.  She is a 25 y.o. [id number] at [redacted]w[redacted]d with di/di twins who presented to [facility]U with PROM of baby A.  Pregnancy has been complicated by cervical shortening and cerclage placement.  She also has history of a prior pre-term delivery at 28 weeks.  These twins are female. The most recent estimated weights from today's ultrasound have not yet been published but parents report ounces that would equate to ~400 grams, AGA.  Counseling We discussed the poor overall survival rate at this gestational age, and the even lower survival rate without significant neurologic impairment.  We discussed the expected hospital length of stay and the significant problems associated with extreme prematurity including respiratory distress syndrome/CLD, IVH/PVL, ROP, NEC, and infection risk. I explained that should they choose critical care interventions, the infant would be intubated immediately in the delivery room.  She understands that despite maximum critical care interventions, her babies may encounter complications that they may not be able to survive.  She also understands that if the babies do survive, they may have life-long medical complexities such as blindness, cerebral palsy, and significant developmental delay.   Given the above information, I explained that there is joint medical decision making for infant's born 22-[redacted] weeks gestation and that there are three options should she delivery prior to 25 weeks   1. Provide maximum critical care intervention, utilizing every measure possible to save their babies' lives.   2. Do not initiate critical care, and instead provide comfort care until the baby passes, allowing parents to hold if they  desire.   3. Initiate a trial of intensive care and see how her babies responds, being open to the idea of with-drawling care should significant complications arise.   I explained that there is no right or wrong decision here, that each family has their own set of values and definitions of quality of life which make this a very personal choice.     I also explained that due to the even higher risk of morbidity/mortality at [redacted] weeks gestation, this institution does not currently offer resuscitation at 22 weeks.  However, there are other hospitals that would attempt intervention at her current gestation age should that be the parent's wishes.   Given that they are twins though, we also discussed that their chances of survival are even smaller at 22 weeks.     Plan Ms. Shoemaker and her partner are currently processing all of the information.  They understand that intervention <23 weeks will not be offered at this hospital, and as of now they do not want intervention prior to that time.  If her pregnancy progresses to 23 weeks, we will be available to offer intervention here in this hospital, and they would like a trial of intensive care at that time.  Parents understand that these decisions are not binding and they may change their mind at any time prior to delivery. Please consider betamethasone treatment for her starting at 22+[redacted] weeks gestation.       Thank you for involving [place] in the care of this patient. A member of our team will be available should the family have additional questions.  Time for consultation was approximately >40 minutes, of  which more than half was face-to-face.   _____________________ Karie Schwalbe, MD Neonatal Medicine 10/24/2020             2:44 PM

## 2020-10-24 NOTE — MAU Note (Signed)
Patient woke up about 1000 with a gush of fluid out of her vagina.  Reports she was seen in MAU two days ago for some light leaking and discharged home.  Today it was a gush and continues leaking.  Fluid was clear.  Also having light spotting.  Having some mild intermittent lower abdominal cramping that she rates a 7/10, "Not as bad."  Pt has a circlage in and hx of preterm labor.  Pregnant with DiDi twins.

## 2020-10-24 NOTE — MAU Provider Note (Signed)
History     CSN: [id number]  Arrival date and time: 10/24/20 1017   Event Date/Time   First Provider Initiated Contact with Patient 10/24/20 1047      Chief Complaint  Patient presents with  . Rupture of Membranes   HPI Megan [name] is a 25 y.o. [phone] at [redacted]w[redacted]d with di/di twins who presents with leaking fluid. Reports leaking watery fluid since 10 am this morning. Leaking has been constant. Endorses some intermittent abdominal cramping. Rates pain 7/10. Denies n/v/d, vaginal bleeding, dysuria, recent intercourse. Hx of preterm delivery & currently has cervical cerclage that was placed last month d/t cervical insufficiency.   OB History    Gravida  4   Para  1   Term  0   Preterm  1   AB  2   Living  1     SAB  1   IAB  1   Ectopic  0   Multiple  0   Live Births  1           Past Medical History:  Diagnosis Date  . Premature delivery   . Preterm labor     Past Surgical History:  Procedure Laterality Date  . CERVICAL CERCLAGE N/A 09/21/2020   Procedure: CERCLAGE CERVICAL;  Surgeon: Marlow Baars, MD;  Location: [facility];  Service: Gynecology;  Laterality: N/A;  . DILATION AND EVACUATION N/A 04/10/2018   Procedure: DILATATION AND EVACUATION;  Surgeon: Myna Hidalgo, DO;  Location: [facility];  Service: Gynecology;  Laterality: N/A;  . INDUCED ABORTION    . WISDOM TOOTH EXTRACTION      Family History  Problem Relation Age of Onset  . Other Neg Hx     Social History   Tobacco Use  . Smoking status: Never Smoker  . Smokeless tobacco: Never Used  Vaping Use  . Vaping Use: Never used  Substance Use Topics  . Alcohol use: Not Currently  . Drug use: No    Allergies: No Known Allergies  Medications Prior to Admission  Medication Sig Dispense Refill Last Dose  . acetaminophen (TYLENOL) 325 MG tablet Take 2 tablets (650 mg total) by mouth every 4 (four) hours as needed (for pain scale < 4). (Patient not taking: No sig reported) 30  tablet 0   . aspirin EC 81 MG tablet Take 81 mg by mouth daily. Swallow whole.     . ferrous sulfate 325 (65 FE) MG tablet Take 1 tablet (325 mg total) by mouth daily with breakfast. (Patient not taking: No sig reported) 60 tablet 3   . Prenatal Multivit-Min-Fe-FA (PRENATAL VITAMINS PO) Take by mouth.     . progesterone 200 MG SUPP Place 200 mg vaginally at bedtime.       Review of Systems  Constitutional: Negative.   Gastrointestinal: Positive for abdominal pain. Negative for diarrhea, nausea and vomiting.  Genitourinary: Positive for vaginal discharge. Negative for dysuria and vaginal bleeding.   Physical Exam   Blood pressure 116/70, pulse 88, temperature 98.2 F (36.8 C), temperature source Oral, resp. rate 18, height 4\' 11"  (1.499 m), weight 85.7 kg, last menstrual period 05/22/2020, SpO2 97 %, unknown if currently breastfeeding.  Physical Exam Vitals and nursing note reviewed. Exam conducted with a chaperone present.  Constitutional:      General: She is not in acute distress.    Appearance: Normal appearance. She is normal weight.  HENT:     Head: Normocephalic and atraumatic.  Eyes:  General: No scleral icterus. Pulmonary:     Effort: Pulmonary effort is normal. No respiratory distress.  Abdominal:     Palpations: Abdomen is soft.     Tenderness: There is no abdominal tenderness.  Genitourinary:    Comments: SSE: moderate amount of clear fluid pooling. Cervix visually closed. Cerclage visualized in place. Digital exam deferred. No blood.  Skin:    General: Skin is warm and dry.  Neurological:     Mental Status: She is alert.  Psychiatric:        Mood and Affect: Mood normal.        Behavior: Behavior normal.    BSUS: Pt informed that the ultrasound is considered a limited OB ultrasound and is not intended to be a complete ultrasound exam.  Patient also informed that the ultrasound is not being completed with the intent of assessing for fetal or placental  anomalies or any pelvic abnormalities.  Explained that the purpose of today's ultrasound is to assess for  viability.  Patient acknowledges the purpose of the exam and the limitations of the study.  Active fetus x 2 with cardiac activity 148/156 bpm . Baby A oblique with head in LLQ with subjectively low fluid. Baby B breech on maternal right side.   MAU Course  Procedures Results for orders placed or performed during the hospital encounter of 10/24/20 (from the past 24 hour(s))  Fern Test     Status: None   Collection Time: 10/24/20 10:59 AM  Result Value Ref Range   POCT Fern Test Positive = ruptured amniotic membanes     MDM FHT present via doppler & BSUS (see note under exam).   Sterile speculum exam performed with obvious rupture of membranes & positive fern slide. Cervix is visually closed.   Dr. Claiborne Billings notified of presentation & will come speak with patient regarding admission  Assessment and Plan   1. Preterm premature rupture of membranes (PPROM) with unknown onset of labor   2. Dichorionic diamniotic twin pregnancy in second trimester   3. Short cervix during pregnancy in second trimester   4. Cervical cerclage suture present in second trimester   5. [redacted] weeks gestation of pregnancy    -Dr. Claiborne Billings on unit to speak with patient regarding plan of care  Judeth Horn 10/24/2020, 10:47 AM

## 2020-10-24 NOTE — MAU Provider Note (Signed)
Chief Complaint:  Abdominal Pain and Rupture of Membranes  HPI: Megan [name] is a 25 y.o. [phone] at [redacted]w[redacted]d who presents to maternity admissions reporting spotting since this morning of dark brown blood, felt like it covered her underwear and she had one large (quarter to half-dollar sized) clot come out this afternoon. Has some cramping but it does not feel like contractions. Denies loss of fluid, decreased fetal movement, fever, falls or recent illness.   Pregnancy Course: Receives care at [facility]. Has a history of preterm births, is on 17-P and has a cerclage in for cervical insufficiency  Past Medical History:  Diagnosis Date  . Premature delivery   . Preterm labor    OB History  Gravida Para Term Preterm AB Living  4 1 0 1 2 1   SAB IAB Ectopic Multiple Live Births  1 1 0 0 1    # Outcome Date GA Lbr Len/2nd Weight Sex Delivery Anes PTL Lv  4 Current           3 SAB           2 IAB              Birth Comments: System Generated. Please review and update pregnancy details.  1 Preterm  [redacted]w[redacted]d    Vag-Spont   LIV   Past Surgical History:  Procedure Laterality Date  . CERVICAL CERCLAGE N/A 09/21/2020   Procedure: CERCLAGE CERVICAL;  Surgeon: 11/21/2020, MD;  Location: [facility];  Service: Gynecology;  Laterality: N/A;  . DILATION AND EVACUATION N/A 04/10/2018   Procedure: DILATATION AND EVACUATION;  Surgeon: 04/12/2018, DO;  Location: [facility];  Service: Gynecology;  Laterality: N/A;  . INDUCED ABORTION    . WISDOM TOOTH EXTRACTION     Family History  Problem Relation Age of Onset  . Other Neg Hx    Social History   Tobacco Use  . Smoking status: Never Smoker  . Smokeless tobacco: Never Used  Vaping Use  . Vaping Use: Never used  Substance Use Topics  . Alcohol use: Not Currently  . Drug use: No   No Known Allergies Medications Prior to Admission  Medication Sig Dispense Refill Last Dose  . acetaminophen (TYLENOL) 325 MG tablet Take 2  tablets (650 mg total) by mouth every 4 (four) hours as needed (for pain scale < 4). (Patient not taking: No sig reported) 30 tablet 0   . aspirin EC 81 MG tablet Take 81 mg by mouth daily. Swallow whole.     . ferrous sulfate 325 (65 FE) MG tablet Take 1 tablet (325 mg total) by mouth daily with breakfast. (Patient not taking: No sig reported) 60 tablet 3   . Prenatal Multivit-Min-Fe-FA (PRENATAL VITAMINS PO) Take by mouth.     . progesterone 200 MG SUPP Place 200 mg vaginally at bedtime.      I have reviewed patient's Past Medical Hx, Surgical Hx, Family Hx, Social Hx, medications and allergies.   ROS:  Pertinent items noted in HPI and remainder of comprehensive ROS otherwise negative.  Physical Exam   Constitutional: Well-developed, well-nourished female in no acute distress.  Cardiovascular: normal rate & rhythm, no murmur Respiratory: normal effort, lung sounds clear throughout GI: Abd soft, non-tender, gravid appropriate for gestational age. Pos BS x 4 MS: Extremities nontender, no edema, normal ROM Neurologic: Alert and oriented x 4.  GU: no CVA tenderness Pelvic: NEFG, scant brown discharge noted around cervix, no active bleeding.  Cervix visually closed, no tension on cerclage. No pooling noted.  FHR (Baby A): 148 FHR (Baby B): 152   Labs: Fern test negative, amnisure negative, wet prep normal  Imaging:  No results found.  MAU Course: Orders Placed This Encounter  Procedures  . Wet prep, genital  . Amnisure rupture of membrane (rom)not at [facility]  . Discharge patient   No orders of the defined types were placed in this encounter.  MDM: Pelvic exam found no active bleeding, just scant dark brown blood mixed with cervical mucus.  Amnisure and fern test negative, membranes intact.  Pain eased somewhat while in MAU, pt expressed desire to go home to bed if her membranes were still intact.   Assessment: 1. Intact amniotic membranes during pregnancy in second trimester    2. [redacted] weeks gestation of pregnancy    Plan: Discharge home in stable condition with preterm labor precautions    Follow-up Information    Ob/Gyn, [facility]. Go to.   Why: as scheduled for ongoing prenatal care Contact information: [address] [place] [address] [phone]               Allergies as of 10/23/2020   No Known Allergies     Medication List    TAKE these medications   acetaminophen 325 MG tablet Commonly known as: Tylenol Take 2 tablets (650 mg total) by mouth every 4 (four) hours as needed (for pain scale < 4).   aspirin EC 81 MG tablet Take 81 mg by mouth daily. Swallow whole.   ferrous sulfate 325 (65 FE) MG tablet Take 1 tablet (325 mg total) by mouth daily with breakfast.   PRENATAL VITAMINS PO Take by mouth.   progesterone 200 MG Supp Place 200 mg vaginally at bedtime.      Edd Arbour, CNM, MSN, IBCLC Certified Nurse Midwife, [facility]

## 2020-10-25 DIAGNOSIS — O42912 Preterm premature rupture of membranes, unspecified as to length of time between rupture and onset of labor, second trimester: Principal | ICD-10-CM

## 2020-10-25 DIAGNOSIS — Z3A23 23 weeks gestation of pregnancy: Secondary | ICD-10-CM

## 2020-10-25 DIAGNOSIS — O30042 Twin pregnancy, dichorionic/diamniotic, second trimester: Secondary | ICD-10-CM

## 2020-10-25 LAB — RPR: RPR Ser Ql: NONREACTIVE

## 2020-10-25 LAB — GC/CHLAMYDIA PROBE AMP (~~LOC~~) NOT AT ARMC
Chlamydia: NEGATIVE
Comment: NEGATIVE
Comment: NORMAL
Neisseria Gonorrhea: NEGATIVE

## 2020-10-25 MED ORDER — FERROUS SULFATE 325 (65 FE) MG PO TABS
325.0000 mg | ORAL_TABLET | Freq: Every day | ORAL | Status: DC
  Administered 2020-10-25 – 2020-10-30 (×6): 325 mg via ORAL
  Filled 2020-10-25 (×6): qty 1

## 2020-10-25 NOTE — Progress Notes (Signed)
Patient ID: Megan [name], female   DOB: [birth year], 25 y.o.   MRN: [id number]  HD#2  25 yo [id number] @ 22+2 w/ di/di twins, cervical incompetence s/p rescue cerclage @ 17 wks, now with PPROM for baby A.  S: Pt continues to have leaking fluid, passed a small clot this morning. Denies contractions, + FM O: AFVSS [place] MFM OB FOLLOW UP 10/24/2020  Narrative ---------------------------------------------------------------------- OBSTETRICS REPORT                        (Signed Final 10/24/2020 11:58 pm) ---------------------------------------------------------------------- Patient Info ID #:       [id number]                          D.O.B.:  May 19, 1996 (25 yrs) Name:       Megan [name]                Visit Date: 10/24/2020 12:20 pm ---------------------------------------------------------------------- Performed By Attending:        Lin Landsman      Ref. Address:      Nestor Ramp MD OBG[facility] [address] [place], [place] [address] Performed By:     Percell Boston          [facility] Referred By:      Marlow Baars MD ---------------------------------------------------------------------- Orders #  Description                           Code        Ordered By 1  [place] MFM OB FOLLOW UP                   22297.98    Philip Aspen 2  [place] MFM OB FOLLOW UP ADDL              92119.41    Philip Aspen GEST ---------------------------------------------------------------------- #  Order #                     Accession #                Episode # 1  [id number]                   [id number]                 [id number] 2  [id number]                   [id number]                 [id number] ---------------------------------------------------------------------- Indications Premature rupture of membranes - leaking        O42.90 fluid [redacted] weeks gestation of pregnancy                 Z3A.22 Cervical insufficiency,  2nd                     O34.32 Poor obstetric history: Previous preterm        O09.219 delivery, antepartum (28 & 15 weeks) Obesity complicating pregnancy, second  K27.062 trimester Cervical cerclage suture present, second        O34.32 trimester ---------------------------------------------------------------------- Fetal Evaluation (Fetus A) Num Of Fetuses:          2 Fetal Heart Rate(bpm):   153 Cardiac Activity:        Observed Fetal Lie:               Maternal left side Presentation:            Cephalic Placenta:                Posterior P. Cord Insertion:       Visualized, central Membrane Desc:      Dividing Membrane seen Amniotic Fluid AFI FV:      Oligohydramnios Largest Pocket(cm) 1.7 ---------------------------------------------------------------------- Biometry (Fetus A) BPD:      50.4  mm     G. Age:  21w 2d         16  %    CI:          80.6  %    70 - 86 FL/HC:       17.9  %    18.4 - 20.2 HC:      177.3  mm     G. Age:  20w 1d        < 1  %    HC/AC:       1.12       1.06 - 1.25 AC:      158.7  mm     G. Age:  21w 0d         12  %    FL/BPD:      63.1  %    71 - 87 FL:       31.8  mm     G. Age:  19w 6d          1  %    FL/AC:       20.0  %    20 - 24 Est. FW:     357   gm    0 lb 13 oz    1.6  %     FW Discordancy        10  % ---------------------------------------------------------------------- OB History Blood Type:   AB+ Gravidity:    4         Term:   0        Prem:   1        SAB:   0 TOP:          1       Ectopic:  0        Living: 1 ---------------------------------------------------------------------- Gestational Age (Fetus A) LMP:           22w 1d        Date:  05/22/20                 EDD:   02/26/21 U/S Today:     20w 4d                                        EDD:   03/09/21 Best:          22w 1d     Det. By:  LMP  (05/22/20)          EDD:   02/26/21 ----------------------------------------------------------------------  Anatomy (Fetus  A) Cranium:               Previously seen        LVOT:                   Previously seen Cavum:                 Previously seen        Aortic Arch:            Previously seen Ventricles:            Previously seen        Ductal Arch:            Previously seen Choroid Plexus:        Previously seen        Diaphragm:              Previously seen Cerebellum:            Previously seen        Stomach:                Appears normal, left sided Posterior Fossa:       Previously seen        Abdomen:                Previously seen Nuchal Fold:           Previously seen        Abdominal Wall:         Previously seen Face:                  Orbits and profile     Cord Vessels:           Previously seen previously seen Lips:                  Previously seen        Kidneys:                Appear normal Palate:                Not well visualized    Bladder:                Appears normal Thoracic:              Appears normal         Spine:                  Previously seen Heart:                 Previously seen        Upper Extremities:      Previously seen RVOT:                  Previously seen        Lower Extremities:      Previously seen Other:  Female gender previously seen. Nasal bone visualized prev. Heels/feet and open hands/5th digits visualized pre. Lenses visualized. VC, 3VV and 3VTV visualized. Technically difficult due to fetal position. ---------------------------------------------------------------------- Doppler - Fetal Vessels (Fetus A) Umbilical Artery S/D    %tile      RI    %tile      PI    %tile            ADFV    RDFV 3.26       27  0.69       31    1.07       26                N        N ---------------------------------------------------------------------- Fetal Evaluation (Fetus B) Num Of Fetuses:          2 Fetal Heart Rate(bpm):   162 Cardiac Activity:        Observed Fetal Lie:               Upper Fetus Presentation:            Transverse, head to maternal  right Placenta:                Posterior P. Cord Insertion:       Previously Visualized Membrane Desc:      Dividing Membrane seen Amniotic Fluid AFI FV:      Within normal limits Largest Pocket(cm) 3.6 ---------------------------------------------------------------------- Biometry (Fetus B) BPD:        47  mm     G. Age:  20w 1d        1.7  %    CI:        69.28   %    70 - 86 FL/HC:       19.6  %    18.4 - 20.2 HC:      180.3  mm     G. Age:  20w 3d        1.2  %    HC/AC:       1.12       1.06 - 1.25 AC:      160.5  mm     G. Age:  21w 1d         15  %    FL/BPD:      75.1  %    71 - 87 FL:       35.3  mm     G. Age:  21w 1d         12  %    FL/AC:       22.0  %    20 - 24 Est. FW:     395   gm    0 lb 14 oz      6  %     FW Discordancy     0 \ 10 % ---------------------------------------------------------------------- Gestational Age (Fetus B) LMP:           22w 1d        Date:  05/22/20                 EDD:   02/26/21 U/S Today:     20w 5d                                        EDD:   03/08/21 Best:          22w 1d     Det. By:  LMP  (05/22/20)          EDD:   02/26/21 ---------------------------------------------------------------------- Anatomy (Fetus B) Cranium:               Appears normal         LVOT:  Previously seen Cavum:                 Previously seen        Aortic Arch:            Previously seen Ventricles:            Previously seen        Ductal Arch:            Previously seen Choroid Plexus:        Previously seen        Diaphragm:              Appears normal Cerebellum:            Previously seen        Stomach:                Appears normal, left sided Posterior Fossa:       Previously seen        Abdomen:                Previously seen Nuchal Fold:           Previously seen        Abdominal Wall:         Appears nml (cord insert, abd wall) Face:                  Orbits and profile     Cord Vessels:           Previously seen previously  seen Lips:                  Previously seen        Kidneys:                Appear normal Palate:                Previously seen        Bladder:                Appears normal Thoracic:              Appears normal         Spine:                  Previously seen Heart:                 Previously seen        Upper Extremities:      Previously seen RVOT:                  Previously seen        Lower Extremities:      Previously seen Other:  Female gender previously seen. Nasal bone visualized prev. Heels/feet and open hands/5th digits visualized prev. Lenses visualized prev. VC, 3VV and 3VTV visualized prev. ---------------------------------------------------------------------- Doppler - Fetal Vessels (Fetus B) Umbilical Artery S/D    %tile      RI    %tile      PI    %tile            ADFV    RDFV 4.3       73    0.77       71    1.31       74                N  N ---------------------------------------------------------------------- Cervix Uterus Adnexa Cervix Not adaquately visualized Uterus No abnormality visualized. Right Ovary No adnexal mass visualized. Left Ovary No adnexal mass visualized. Cul De Sac No free fluid seen. Adnexa No abnormality visualized. ---------------------------------------------------------------------- Impression Follow up dichorionic diamnoitic twin pregnancy with IUGR in Twin A and B Twin A normal stomach, oligohydraminos, bladder and-EFW 1.6%  Maternal left, Cephalic Twin B normal stomach, amniotic fluid, bladder and BPP 8/8- Transverse Normal UA Dopplers seen in twin A and B no evidence of AEDF or REDF. Known cerclage with suspected ---------------------------------------------------------------------- Recommendations Continue inpatient observation. Weekly testing with UA dopplers assessment. ---------------------------------------------------------------------- Lin Landsman, MD Electronically Signed Final Report   10/24/2020 11:58  pm ----------------------------------------------------------------------  A/P 1) PPROM: abx for latency 2) BMZ @22 +5 & 22+6 3) Magnesium for neuroprotection after 22+5 if delivery imminent. Consider indomethacin for tocolysis

## 2020-10-25 NOTE — Consult Note (Signed)
MFM Consultation   Reason for request: New PPROM of Twin A Requesting provider: Dr. Philip Aspen Date of Service: 10/25/20  Megan [name] is a 25 yo G4P1 who is admitted to the hospital for new onset preterm premature rupture of membranes in Twin A of a diamniotic dichorionic twin pair. She is seen today at the request of Dr. Claiborne Billings.  Megan [name] is known to [place] as we performed an outpatient consultation for history of cervical incompetence and discovered a shortened cervix. She opted to have a ultrasound indicated cerclage that was performed without incident. Please see those notes for details.  In addition, she is being monitored for fetal growth restriction in both Twin A and B. There is normal UA Dopplers please see detailed report.  She present on 06/08 with new on set LOF and was found to have intermittent abdominal cramping. She had a positive examination for amniotic fluid. Since that time she has received antibiotics, a NICU consultation. She reports that she has had intermittent spotting but not significant contractions.  I discussed the plan of care with Dr. Claiborne Billings with plan for inpatient admission with a goal of betamethasone and tocolysis at 23 weeks. She will also receive magnesium sulfate for neuroprotection if delivery is suspected with 24 - 48 hours.  Vitals with BMI 10/25/2020 10/25/2020 10/25/2020  Height - - -  Weight - - -  BMI - - -  Systolic 89 98 105  Diastolic 37 51 49  Pulse 96 87 91   CMP Latest Ref Rng & Units 02/07/2014 11/13/2013  Glucose 70 - 99 mg/dL 314(H) 90  BUN 6 - 23 mg/dL 11 9  Creatinine 7.02 - 1.10 mg/dL 6.37 8.58  Sodium 850 - 147 mEq/L 139 142  Potassium 3.7 - 5.3 mEq/L 4.4 3.9  Chloride 96 - 112 mEq/L 102 101  CO2 19 - 32 mEq/L 23 -  Calcium 8.4 - 10.5 mg/dL 9.7 -  Total Protein 6.0 - 8.3 g/dL 8.2 -  Total Bilirubin 0.3 - 1.2 mg/dL 0.4 -  Alkaline Phos 39 - 117 U/L 74 -  AST 0 - 37 U/L 16 -  ALT 0 - 35 U/L 17 -   CBC Latest Ref Rng & Units  10/24/2020 09/21/2020 04/11/2018  WBC 4.0 - 10.5 K/uL 11.6(H) 11.5(H) 18.6(H)  Hemoglobin 12.0 - 15.0 g/dL 2.7(X) 10.7(L) 9.1(L)  Hematocrit 36.0 - 46.0 % 31.0(L) 33.0(L) 28.1(L)  Platelets 150 - 400 K/uL 390 396 294   Current Facility-Administered Medications (Endocrine & Metabolic):    [START ON 10/28/2020] betamethasone acetate-betamethasone sodium phosphate (CELESTONE) injection 12 mg       Current Facility-Administered Medications (Analgesics):    acetaminophen (TYLENOL) tablet 650 mg   Current Facility-Administered Medications (Hematological):    ferrous sulfate tablet 325 mg   Current Facility-Administered Medications (Other):    ampicillin (OMNIPEN) 2 g in sodium chloride 0.9 % 100 mL IVPB **IN FOLLOWED-BY LINKED GROUP WITH** [START ON 10/26/2020] amoxicillin (AMOXIL) capsule 500 mg   calcium carbonate (TUMS - dosed in mg elemental calcium) chewable tablet 400 mg of elemental calcium   docusate sodium (COLACE) capsule 100 mg   erythromycin 250 mg in sodium chloride 0.9 % 100 mL IVPB **IN FOLLOWED-BY LINKED GROUP WITH** [START ON 10/26/2020] erythromycin (E-MYCIN) tablet 250 mg   lactated ringers infusion   prenatal multivitamin tablet 1 tablet   zolpidem (AMBIEN) tablet 5 mg  No current outpatient medications on file.   History is reviewed and up to date.   Impression/Counseling:  I reviewed this plan of care with Megan [name] and agree with the current plan of admission with BMZ and Tocolysis at 23 weeks.  In addition, I reviewed the increased risk of infection and bleeding are the most common causes for delivery. Other factor include placental abruption and cord prolapse.  Cases such as this are also at an increased risk for cesarean delivery including a classical type, retained placenta and postpartum endometritis. Fetal death may also occur unexpectedly.  There are times when the first twin may labor and delivery. If this were to occur it is reasonable to expectantly  manage without proceeding with delivery of Twin B if there is no sign or symptoms of infection. This is called a delayed interval delivery. This approach should be individualized however, Megan [name] would consider this option if we deem that it is safe. However, if there is significant bleeding of infection this expectant management of the second twin is contraindicated.   We discussed that current data suggest that the mean latency is 10-14 days if the mother has not delivered within the first 24 hours. However, > 84% of patients of delivered by 28 days post membrane rupture.  However, I explained that even if delivery is postponed morbidity is increased in Twin A due the increased risk of pulmonary hypoplasia. She was able to convey the risk discuss by Dr. Burnadette Pop with the NICU team.   Lastly, we recommend removing the cerclage if steady painful uterine contractions or heavy bleeding presents.  Cesarean delivery is recommended for obstetrical indications.  Continue weekly testing with UA Dopplers.  All questions answered.  I spent 60 minutes with > 50% in face to face consultation.  Novella Olive, MD.

## 2020-10-25 NOTE — Progress Notes (Signed)
Upon assessment, pt updated nurse that pt passed dime size clot while in restroom and had bleeding with wiping. Pt endorses cramping while standing that resolves when back in bed. Dr. Claiborne Billings notified and verbal orders given to watch peri pad for bleeding and update in an hour if bleeding increases. Pt states the bleeding is only seen when wiping

## 2020-10-26 DIAGNOSIS — Z3A22 22 weeks gestation of pregnancy: Secondary | ICD-10-CM

## 2020-10-26 DIAGNOSIS — O3432 Maternal care for cervical incompetence, second trimester: Secondary | ICD-10-CM

## 2020-10-26 DIAGNOSIS — O365921 Maternal care for other known or suspected poor fetal growth, second trimester, fetus 1: Secondary | ICD-10-CM | POA: Diagnosis not present

## 2020-10-26 DIAGNOSIS — O42912 Preterm premature rupture of membranes, unspecified as to length of time between rupture and onset of labor, second trimester: Secondary | ICD-10-CM

## 2020-10-26 DIAGNOSIS — O99212 Obesity complicating pregnancy, second trimester: Secondary | ICD-10-CM

## 2020-10-26 DIAGNOSIS — O365922 Maternal care for other known or suspected poor fetal growth, second trimester, fetus 2: Secondary | ICD-10-CM | POA: Diagnosis not present

## 2020-10-26 DIAGNOSIS — E669 Obesity, unspecified: Secondary | ICD-10-CM

## 2020-10-26 DIAGNOSIS — O30042 Twin pregnancy, dichorionic/diamniotic, second trimester: Secondary | ICD-10-CM

## 2020-10-26 DIAGNOSIS — O09212 Supervision of pregnancy with history of pre-term labor, second trimester: Secondary | ICD-10-CM

## 2020-10-26 NOTE — Plan of Care (Signed)
Patient doing well this evening. Has minor back discomfort which has been on-going and not changed states it is 2-3/10 and heat/repositioning has helped. Patient endorses good fetal movement. She is aware to immediately call for changes in condition, bleeding or change in fluid amount/smell/consistency.

## 2020-10-26 NOTE — Progress Notes (Signed)
Patient is comfortable in bed, some unchanged mild backpain, denies contractions, abdominal tenderness.  No bleeding since small clot and blood with wiping yesterday morning.  Good FM.  No fever.  Vitals:   10/25/20 1520 10/25/20 1938 10/26/20 0009 10/26/20 0820  BP: 109/61 113/66 122/76 (!) 99/59  Pulse: 85 91 97 87  Resp: 18 17 18 18   Temp: 98.1 F (36.7 C) 98.2 F (36.8 C) 98 F (36.7 C) 98.3 F (36.8 C)  TempSrc: Oral Oral Oral Oral  SpO2: 99% 100% 100% 100%  Weight:      Height:        Fundus nontender Ext: no calf tenderness  Lab Results  Component Value Date   WBC 11.6 (H) 10/24/2020   HGB 9.7 (L) 10/24/2020   HCT 31.0 (L) 10/24/2020   MCV 73.3 (L) 10/24/2020   PLT 390 10/24/2020    --/--/AB POS (06/08 1137)  A/P HD#3 12-22-1990 @ 22.3 didi twins, PPROM baby A, cerclage in place for cervical insufficiency @17  weeks, babies with IUGR Continue latency abx Betamethasone ordered to start at 22.5, consider tocolysis with indomethcin when starting beta course per MFM MgSO4 only if reaching viability and delivery imminent in 24 hrs Last [id number]:  Baby A cephalic, Baby B transverse S/p NICU consult: no resuscitation/ intervention until threshold 23.0 S/p MFM consult- agree with plan above, also pt may be candidate for delayed interval delivery baby B, see Dr. note GC/CT/GBS/HIV/RPR all negative FHT daily until viability, consider toco with new onset symptoms  Routine care.  Will remain inpatient until delivery.  [place]

## 2020-10-27 LAB — CBC
HCT: 28 % — ABNORMAL LOW (ref 36.0–46.0)
Hemoglobin: 8.9 g/dL — ABNORMAL LOW (ref 12.0–15.0)
MCH: 23.1 pg — ABNORMAL LOW (ref 26.0–34.0)
MCHC: 31.8 g/dL (ref 30.0–36.0)
MCV: 72.5 fL — ABNORMAL LOW (ref 80.0–100.0)
Platelets: 372 10*3/uL (ref 150–400)
RBC: 3.86 MIL/uL — ABNORMAL LOW (ref 3.87–5.11)
RDW: 16.1 % — ABNORMAL HIGH (ref 11.5–15.5)
WBC: 10.8 10*3/uL — ABNORMAL HIGH (ref 4.0–10.5)
nRBC: 0.3 % — ABNORMAL HIGH (ref 0.0–0.2)

## 2020-10-27 LAB — TYPE AND SCREEN
ABO/RH(D): AB POS
Antibody Screen: NEGATIVE

## 2020-10-27 NOTE — Progress Notes (Signed)
25 y.o. [id number] [redacted]w[redacted]d HD#3 admitted for Preterm premature rupture of membranes (PPROM) with unknown onset of labor [O42.919].  Twins.  Pt currently stable with no c/o.  Good FM.  Vitals:   10/26/20 1143 10/26/20 1531 10/26/20 2213 10/27/20 0506  BP: 111/61 115/62 121/71   Pulse: 96 91 80   Resp: 18 18 17 15   Temp: 98.5 F (36.9 C) 97.8 F (36.6 C) 97.9 F (36.6 C) 98.1 F (36.7 C)  TempSrc: Oral Oral Oral Oral  SpO2: 100% 98% 97%   Weight:      Height:        Lungs CTA Cor RRR Abd  Soft, gravid, nontender Ex SCDs FHTs  presentx2 Toco  none over night  No results found for this or any previous visit (from the past 24 hour(s)).  A:  HD#3  [redacted]w[redacted]d with PPROM A, B breech.  No labor or signs of infection.  P: Continue to monitor for now before viability at 23 weeks. At 22 5/7, will do BMZ and consider indocin. If >23 weeks, can do magnesium sulfate for neuroprotection. In the cirucumstance of delivery of A, it is possible if no infection and labor stops for her to remain pregnant with B.    All of above was recommended by MFM.    7/7

## 2020-10-28 MED ORDER — SODIUM CHLORIDE 0.9 % IV SOLN: 510.0000 mg | Freq: Once | INTRAVENOUS | Status: DC

## 2020-10-28 MED ORDER — SODIUM CHLORIDE 0.9 % IV SOLN
500.0000 mg | Freq: Once | INTRAVENOUS | Status: AC
  Administered 2020-10-28: 500 mg via INTRAVENOUS
  Filled 2020-10-28: qty 25

## 2020-10-28 MED ORDER — METHYLPREDNISOLONE SODIUM SUCC 125 MG IJ SOLR: 125.0000 mg | Freq: Once | INTRAMUSCULAR | Status: DC | PRN

## 2020-10-28 MED ORDER — SODIUM CHLORIDE 0.9 % IV BOLUS: 500.0000 mL | Freq: Once | INTRAVENOUS | Status: DC | PRN

## 2020-10-28 MED ORDER — DIPHENHYDRAMINE HCL 50 MG/ML IJ SOLN: 25.0000 mg | Freq: Once | INTRAMUSCULAR | Status: DC | PRN

## 2020-10-28 MED ORDER — EPINEPHRINE PF 1 MG/ML IJ SOLN
0.3000 mg | Freq: Once | INTRAMUSCULAR | Status: DC | PRN
  Filled 2020-10-28: qty 1

## 2020-10-28 MED ORDER — BETAMETHASONE SOD PHOS & ACET 6 (3-3) MG/ML IJ SUSP: 12.0000 mg | INTRAMUSCULAR | Status: DC

## 2020-10-28 MED ORDER — ALBUTEROL SULFATE (2.5 MG/3ML) 0.083% IN NEBU: 2.5000 mg | INHALATION_SOLUTION | Freq: Once | RESPIRATORY_TRACT | Status: DC | PRN

## 2020-10-28 MED ORDER — SODIUM CHLORIDE 0.9 % IV SOLN
INTRAVENOUS | Status: DC | PRN
  Administered 2020-10-28: 250 mL via INTRAVENOUS

## 2020-10-28 NOTE — Progress Notes (Signed)
25 y.o. [id number] [redacted]w[redacted]d HD#4 admitted for Preterm premature rupture of membranes (PPROM) with unknown onset of labor [O42.919].  Pt currently stable with no c/o now.  Last night had some small clots and some cramping.  Good FM.  Vitals:   10/27/20 2352 10/28/20 0515 10/28/20 0800 10/28/20 0801  BP: (!) 112/55 109/68 (!) 101/59   Pulse: 83 88 92   Resp:  17    Temp: 97.8 F (36.6 C) 98.4 F (36.9 C)  98.3 F (36.8 C)  TempSrc: Oral Oral  Oral  SpO2: 97% 98%  97%  Weight:      Height:        Lungs CTA Cor RRR Abd  Soft, gravid, nontender Ex SCDs FHTs  x2 Toco  none today  Results for orders placed or performed during the hospital encounter of 10/24/20 (from the past 24 hour(s))  CBC     Status: Abnormal   Collection Time: 10/27/20 11:48 AM  Result Value Ref Range   WBC 10.8 (H) 4.0 - 10.5 K/uL   RBC 3.86 (L) 3.87 - 5.11 MIL/uL   Hemoglobin 8.9 (L) 12.0 - 15.0 g/dL   HCT 60.6 (L) 30.1 - 60.1 %   MCV 72.5 (L) 80.0 - 100.0 fL   MCH 23.1 (L) 26.0 - 34.0 pg   MCHC 31.8 30.0 - 36.0 g/dL   RDW 09.3 (H) 23.5 - 57.3 %   Platelets 372 150 - 400 K/uL   nRBC 0.3 (H) 0.0 - 0.2 %  Type and screen [facility]     Status: None   Collection Time: 10/27/20 11:49 AM  Result Value Ref Range   ABO/RH(D) AB POS    Antibody Screen NEG    Sample Expiration      10/30/2020,2359 Performed at [facility], [address] [place], [place] [address]     A:  HD#4  [redacted]w[redacted]d with twins and PPROM of A, cerclage in place.  P: Viability in 2 days; per MFM today with do betamethasone x2 doses and indocin if cramping again.  Baby A is oblique and Baby B breech.  Would not do intervention until viable at 23 weeks.    After that, c/s would be indicated per MFM for obstetric reasons.   Pt anemic, on iron but will do iron infusion today.  T&S is up to date.    Loney Laurence

## 2020-10-28 NOTE — Progress Notes (Signed)
   10/28/20 1017  Clinical Encounter Type  Visited With Patient  Visit Type Spiritual support  Referral From Nurse  Consult/Referral To Chaplain  Spiritual Encounters  Spiritual Needs Emotional;Prayer  Stress Factors  Patient Stress Factors Health changes  Nurse Holy paged for the chaplain to visit [facility] for support.  She is pregnant with twins and could go into labor and concerned about the delivery and the precious baby girls.  Chaplain offered emotional support and encouragement. Batya and Chaplain had prayer and was able to talk about her concerns.    Chaplain will follow-up with Ebonique later today.  Chaplain Tykel Badie Morgan-Simpson [phone]

## 2020-10-29 ENCOUNTER — Encounter (HOSPITAL_COMMUNITY): Payer: Self-pay | Admitting: Obstetrics and Gynecology

## 2020-10-29 MED ORDER — ASPIRIN EC 81 MG PO TBEC
81.0000 mg | DELAYED_RELEASE_TABLET | Freq: Every day | ORAL | Status: DC
  Administered 2020-10-29 – 2020-10-30 (×2): 81 mg via ORAL
  Filled 2020-10-29 (×3): qty 1

## 2020-10-29 NOTE — Progress Notes (Signed)
Megan [name] 25 y.o. [id number] at [redacted]w[redacted]d HD#6 admitted with PPROM S: Reports small amount of bleeding when she wipes, back pain that improves with tylenol, some pelvic pressure, now improving. Denies contractions. Reports FM x2. Denies any fever/chills, discharge.   O: Vitals:   10/28/20 1612 10/28/20 1936 10/29/20 0356 10/29/20 0753  BP: 115/60 111/60 (!) 114/45 113/61  Pulse: 95 100 93 89  Resp: 17 18 18 18   Temp:  98 F (36.7 C) 98.2 F (36.8 C) 98.3 F (36.8 C)  TempSrc:  Oral Oral Oral  SpO2: 100% 100% 98% 97%  Weight:      Height:       Resting in bed Abdomen soft and non tender Toco quiet  A/p: Megan [name] 25 y.o. Megan [name] at [redacted]w[redacted]d HD#6 admitted with previable PPROM twin A, DiDi twins now stable PPROM 6/8, on latency antibiotics until 6/15. Reviewed with patient monitoring for signs of infection and labor Prematurity: Seen by NICU and MFM on admission. Patient currently desires trial of intensive care at 23 weeks. S/p BMZ 6/12-13, consider tocolysis at 23 weeks, plan for mag sulfate for neuro protection if delivery is suspected within 24-48 hours Threatened PTL: has had cramping and spotting on and off. Was on continuous toco and IVF, discontinued this AM since symptoms had improved Cervical incompetency, s/p ultrasound indicated cerclage. Cerclage remains in place  DiDi twins with Fetal growth restriction: 10/24/2020 12/24/2020: A 357g 1.6%, AC 12%, cephalic B 395g 6% AC 15%. Transverse. Discordance 10%. Continue weekly testing with UA dopplers Fetal testing: FHT daily until viability, discussed NST starting tomorrow Delivery: MFM recommends CS for obstetrical indications. GBS negative Iron deficiency anemia: s/p venofer 6/12, on PO iron  Routine prenatal care: on PNV, aspirin for preeclampsia prevention  Megan [name] 10/29/20 11:21 AM

## 2020-10-30 LAB — CBC
HCT: 26.8 % — ABNORMAL LOW (ref 36.0–46.0)
Hemoglobin: 8.5 g/dL — ABNORMAL LOW (ref 12.0–15.0)
MCH: 23.4 pg — ABNORMAL LOW (ref 26.0–34.0)
MCHC: 31.7 g/dL (ref 30.0–36.0)
MCV: 73.8 fL — ABNORMAL LOW (ref 80.0–100.0)
Platelets: 436 10*3/uL — ABNORMAL HIGH (ref 150–400)
RBC: 3.63 MIL/uL — ABNORMAL LOW (ref 3.87–5.11)
RDW: 16.4 % — ABNORMAL HIGH (ref 11.5–15.5)
WBC: 22.8 10*3/uL — ABNORMAL HIGH (ref 4.0–10.5)
nRBC: 2.9 % — ABNORMAL HIGH (ref 0.0–0.2)

## 2020-10-30 NOTE — Progress Notes (Signed)
Alerted by RN that ongoing attempt to document 2 discernable FHT by NST was not successful.  She and 3 other RNs had attempted and felt unsure if both were being documented.  I came to bedside with [place] to identified location and FHTs.  Baby A low near pelvic rami and to the right. Baby B midline and midpoint between umbilicus and pubic bone.  Both FHTS in normal range by [place].  RN continued to attempt to document by NST tracing, but still unsure if 2 distinct FHT were being identified.  I called Dr. Parke Poisson to discuss.  He advised given the previable weights and ongoing challenges. That NST was not helpful and should be discontinued and FHT by doppler should be documented qshift.  Will readdress possibly moving back to NST attempt at 24 weeks.

## 2020-10-30 NOTE — Progress Notes (Signed)
  HD#7 Patient is comfortable in bed.  Denies CP/SOB, denies fever or abdominal tenderness.  Denies calf tenderness.  Reports episodes of sharp back pain last night, but resolved currently.  Reports was very concerned last night having passed 2 small clots that were larger than prior clots.  No active bleeding, just pink with wiping.  She reports pressure when standing.  Denies contractions.  Vitals:   10/29/20 1929 10/29/20 2256 10/30/20 0354 10/30/20 0728  BP: (!) 117/56 (!) 108/51 (!) 109/52 (!) 107/56  Pulse: 92 92 87 82  Resp: 18 18 18 18   Temp: 98 F (36.7 C) 97.7 F (36.5 C)  98 F (36.7 C)  TempSrc: Oral Oral  Oral  SpO2: 98% 96% 98% 98%  Weight:      Height:       General: comfortable, conversant, NAD Abd: gravid, nontender Ext: no calf tenderness  Lab Results  Component Value Date   WBC 10.8 (H) 10/27/2020   HGB 8.9 (L) 10/27/2020   HCT 28.0 (L) 10/27/2020   MCV 72.5 (L) 10/27/2020   PLT 372 10/27/2020    --/--/AB POS (06/11 1149)  A/P HD #7 25 y.o. 34 at [redacted]w[redacted]d HD#7 admitted with previable PPROM twin A, DiDi twins  with IUGR, now stable PPROM 6/8, on latency antibiotics until 6/15. Reviewed with patient monitoring for signs of infection and labor.  Currently afebrile, abdomen non-tender, not contracting. Prematurity: Seen by NICU and MFM on admission. Patient currently desires trial of intensive care now that she is 23 weeks. S/p BMZ 6/12-13, plan for mag sulfate for neuro protection if delivery is suspected within 24-48 hours.  Discuss tocolysis if symptoms develop. Cervical incompetency, s/p ultrasound indicated cerclage. Cerclage remains in place  DiDi twins with Fetal growth restriction: 10/24/2020 12/24/2020: A 357g 1.6%, AC 12%, cephalic B 395g 6% AC 15%. Transverse. Discordance 10%. Continue weekly testing with UA dopplers per MFM note, ordered for 6/15. Fetal testing:  NSTs daily, discussed with MFM who agrees, not expecting reactivity, but will monitor for  decels. Delivery: MFM recommends CS for obstetrical indications. GBS negative Iron deficiency anemia: s/p venofer 6/12, on PO iron Routine prenatal care: on PNV, aspirin for preeclampsia prevention Discussed above plan with Dr. 8/12, MFM who agrees  Routine care.    Megan [name]

## 2020-10-30 NOTE — Progress Notes (Signed)
Chaplain asked open ended questions to facilitate story telling and emotional expression. Pt was in bed when Chaplain visited. She shared that today has been a tough day because she's had some bleeding. Megan [name] expressed her fear and worry that her daughters will be born early. She is grateful to have reached 23 weeks, but aware that her babies need more time to grow. Megan [name] is reluctant to attach too much and hesitates naming her girls because of the loss of her son Megan [name] around 17 weeks in 2020. Chaplain offered emotional and spiritual support and offered pt opportunity to explore how she gets through diffiicult situations. Pt shared that she prays and tries to pass time by watching tv and sleeping. Her partner visits in the evening and Megan [name] is also a good support. Pt requested prayer which chaplain provided.   Please page as further needs arise.  Megan [name]. Megan [name], M.Div. [facility] Chaplain Pager [phone] Office [phone]

## 2020-10-31 ENCOUNTER — Inpatient Hospital Stay (HOSPITAL_COMMUNITY): Payer: No Typology Code available for payment source | Admitting: Anesthesiology

## 2020-10-31 ENCOUNTER — Inpatient Hospital Stay (HOSPITAL_COMMUNITY): Payer: No Typology Code available for payment source

## 2020-10-31 ENCOUNTER — Encounter (HOSPITAL_COMMUNITY): Payer: Self-pay | Admitting: Obstetrics and Gynecology

## 2020-10-31 LAB — DIC (DISSEMINATED INTRAVASCULAR COAGULATION)PANEL
D-Dimer, Quant: 1.52 ug/mL-FEU — ABNORMAL HIGH (ref 0.00–0.50)
Fibrinogen: 609 mg/dL — ABNORMAL HIGH (ref 210–475)
INR: 1 (ref 0.8–1.2)
Platelets: 439 10*3/uL — ABNORMAL HIGH (ref 150–400)
Prothrombin Time: 13.3 seconds (ref 11.4–15.2)
Smear Review: NONE SEEN
aPTT: 23 seconds — ABNORMAL LOW (ref 24–36)

## 2020-10-31 LAB — CBC
HCT: 28.5 % — ABNORMAL LOW (ref 36.0–46.0)
HCT: 26.7 % — ABNORMAL LOW (ref 36.0–46.0)
Hemoglobin: 9 g/dL — ABNORMAL LOW (ref 12.0–15.0)
Hemoglobin: 8.6 g/dL — ABNORMAL LOW (ref 12.0–15.0)
MCH: 23.4 pg — ABNORMAL LOW (ref 26.0–34.0)
MCH: 23.8 pg — ABNORMAL LOW (ref 26.0–34.0)
MCHC: 31.6 g/dL (ref 30.0–36.0)
MCHC: 32.2 g/dL (ref 30.0–36.0)
MCV: 74 fL — ABNORMAL LOW (ref 80.0–100.0)
MCV: 73.8 fL — ABNORMAL LOW (ref 80.0–100.0)
Platelets: 436 10*3/uL — ABNORMAL HIGH (ref 150–400)
Platelets: 370 10*3/uL (ref 150–400)
RBC: 3.85 MIL/uL — ABNORMAL LOW (ref 3.87–5.11)
RBC: 3.62 MIL/uL — ABNORMAL LOW (ref 3.87–5.11)
RDW: 16.6 % — ABNORMAL HIGH (ref 11.5–15.5)
RDW: 16.6 % — ABNORMAL HIGH (ref 11.5–15.5)
WBC: 26.5 10*3/uL — ABNORMAL HIGH (ref 4.0–10.5)
WBC: 24.9 10*3/uL — ABNORMAL HIGH (ref 4.0–10.5)
nRBC: 4.3 % — ABNORMAL HIGH (ref 0.0–0.2)
nRBC: 3.8 % — ABNORMAL HIGH (ref 0.0–0.2)

## 2020-10-31 LAB — PREPARE RBC (CROSSMATCH)

## 2020-10-31 LAB — GLUCOSE, CAPILLARY: Glucose-Capillary: 227 mg/dL — ABNORMAL HIGH (ref 70–99)

## 2020-10-31 MED ORDER — PRENATAL MULTIVITAMIN CH
1.0000 | ORAL_TABLET | Freq: Every day | ORAL | Status: DC
  Administered 2020-11-01: 1 via ORAL
  Filled 2020-10-31: qty 1

## 2020-10-31 MED ORDER — FENTANYL CITRATE (PF) 100 MCG/2ML IJ SOLN
100.0000 ug | INTRAMUSCULAR | Status: DC | PRN
  Administered 2020-10-31 (×2): 100 ug via INTRAVENOUS
  Filled 2020-10-31: qty 2

## 2020-10-31 MED ORDER — MAGNESIUM SULFATE 40 GM/1000ML IV SOLN
2.0000 g/h | INTRAVENOUS | Status: DC
  Filled 2020-10-31: qty 1000

## 2020-10-31 MED ORDER — DIBUCAINE (PERIANAL) 1 % EX OINT: 1.0000 "application " | TOPICAL_OINTMENT | CUTANEOUS | Status: DC | PRN

## 2020-10-31 MED ORDER — DIPHENHYDRAMINE HCL 25 MG PO CAPS: 25.0000 mg | ORAL_CAPSULE | Freq: Four times a day (QID) | ORAL | Status: DC | PRN

## 2020-10-31 MED ORDER — MAGNESIUM SULFATE 4 GM/100ML IV SOLN: 4.0000 g | Freq: Once | INTRAVENOUS | Status: DC

## 2020-10-31 MED ORDER — COCONUT OIL OIL: 1.0000 "application " | TOPICAL_OIL | Status: DC | PRN

## 2020-10-31 MED ORDER — FERROUS SULFATE 325 (65 FE) MG PO TABS
325.0000 mg | ORAL_TABLET | Freq: Every day | ORAL | Status: DC
  Administered 2020-11-01: 325 mg via ORAL
  Filled 2020-10-31: qty 1

## 2020-10-31 MED ORDER — TRANEXAMIC ACID-NACL 1000-0.7 MG/100ML-% IV SOLN: 1000.0000 mg | Freq: Once | INTRAVENOUS | Status: DC | PRN

## 2020-10-31 MED ORDER — FENTANYL CITRATE (PF) 100 MCG/2ML IJ SOLN
INTRAMUSCULAR | Status: AC
  Filled 2020-10-31: qty 2

## 2020-10-31 MED ORDER — TRANEXAMIC ACID-NACL 1000-0.7 MG/100ML-% IV SOLN
INTRAVENOUS | Status: AC
  Administered 2020-10-31: 1000 mg via INTRAVENOUS
  Filled 2020-10-31: qty 100

## 2020-10-31 MED ORDER — FENTANYL-BUPIVACAINE-NACL 0.5-0.125-0.9 MG/250ML-% EP SOLN
EPIDURAL | Status: DC | PRN
  Administered 2020-10-31: 12 mL/h via EPIDURAL

## 2020-10-31 MED ORDER — IBUPROFEN 600 MG PO TABS
600.0000 mg | ORAL_TABLET | Freq: Four times a day (QID) | ORAL | Status: DC
  Administered 2020-10-31 – 2020-11-01 (×4): 600 mg via ORAL
  Filled 2020-10-31 (×4): qty 1

## 2020-10-31 MED ORDER — MAGNESIUM SULFATE 40 GM/1000ML IV SOLN: 2.0000 g/h | INTRAVENOUS | Status: DC

## 2020-10-31 MED ORDER — TETANUS-DIPHTH-ACELL PERTUSSIS 5-2.5-18.5 LF-MCG/0.5 IM SUSY
0.5000 mL | PREFILLED_SYRINGE | Freq: Once | INTRAMUSCULAR | Status: AC
  Administered 2020-11-01: 0.5 mL via INTRAMUSCULAR
  Filled 2020-10-31: qty 0.5

## 2020-10-31 MED ORDER — SENNOSIDES-DOCUSATE SODIUM 8.6-50 MG PO TABS
2.0000 | ORAL_TABLET | ORAL | Status: DC
  Administered 2020-11-01: 2 via ORAL
  Filled 2020-10-31: qty 2

## 2020-10-31 MED ORDER — WITCH HAZEL-GLYCERIN EX PADS: 1.0000 "application " | MEDICATED_PAD | CUTANEOUS | Status: DC | PRN

## 2020-10-31 MED ORDER — OXYCODONE HCL 5 MG PO TABS: 5.0000 mg | ORAL_TABLET | ORAL | Status: DC | PRN

## 2020-10-31 MED ORDER — LACTATED RINGERS IV SOLN: INTRAVENOUS | Status: DC

## 2020-10-31 MED ORDER — OXYTOCIN-SODIUM CHLORIDE 30-0.9 UT/500ML-% IV SOLN: 10.0000 [IU]/h | INTRAVENOUS | Status: DC

## 2020-10-31 MED ORDER — TRANEXAMIC ACID-NACL 1000-0.7 MG/100ML-% IV SOLN: 1000.0000 mg | INTRAVENOUS | Status: AC

## 2020-10-31 MED ORDER — MAGNESIUM SULFATE BOLUS VIA INFUSION
4.0000 g | Freq: Once | INTRAVENOUS | Status: AC
  Administered 2020-10-31: 4 g via INTRAVENOUS
  Filled 2020-10-31: qty 1000

## 2020-10-31 MED ORDER — ONDANSETRON HCL 4 MG/2ML IJ SOLN: 4.0000 mg | INTRAMUSCULAR | Status: DC | PRN

## 2020-10-31 MED ORDER — BENZOCAINE-MENTHOL 20-0.5 % EX AERO: 1.0000 "application " | INHALATION_SPRAY | CUTANEOUS | Status: DC | PRN

## 2020-10-31 MED ORDER — OXYCODONE HCL 5 MG PO TABS: 10.0000 mg | ORAL_TABLET | ORAL | Status: DC | PRN

## 2020-10-31 MED ORDER — LIDOCAINE HCL (PF) 1 % IJ SOLN
INTRAMUSCULAR | Status: DC | PRN
  Administered 2020-10-31: 10 mL via EPIDURAL
  Administered 2020-10-31: 2 mL via EPIDURAL

## 2020-10-31 MED ORDER — SIMETHICONE 80 MG PO CHEW: 80.0000 mg | CHEWABLE_TABLET | ORAL | Status: DC | PRN

## 2020-10-31 MED ORDER — SODIUM CHLORIDE 0.9% IV SOLUTION: Freq: Once | INTRAVENOUS | Status: DC

## 2020-10-31 MED ORDER — ACETAMINOPHEN 325 MG PO TABS
650.0000 mg | ORAL_TABLET | ORAL | Status: DC | PRN
  Administered 2020-10-31: 650 mg via ORAL
  Filled 2020-10-31: qty 2

## 2020-10-31 MED ORDER — FENTANYL-BUPIVACAINE-NACL 0.5-0.125-0.9 MG/250ML-% EP SOLN
EPIDURAL | Status: AC
  Filled 2020-10-31: qty 250

## 2020-10-31 MED ORDER — OXYTOCIN-SODIUM CHLORIDE 30-0.9 UT/500ML-% IV SOLN
INTRAVENOUS | Status: AC
  Administered 2020-10-31: 36 [IU]/h via INTRAVENOUS
  Filled 2020-10-31: qty 500

## 2020-10-31 MED ORDER — ONDANSETRON HCL 4 MG PO TABS: 4.0000 mg | ORAL_TABLET | ORAL | Status: DC | PRN

## 2020-10-31 NOTE — Progress Notes (Signed)
Called by RN at 4:36am reporting pt had gotten up to bathroom and felt pressure, like something was in her vagina.  Per RN she examined visually and was able to see head.  Shortly thereafter I arrived to patient room where faculty physician and NICU was in attendance.  Baby A was being resuscitated and the cord for placenta A was clamped and cut.  No bleeding was noted.  Bedside US was performed and Baby B was found to be cephalic. FHT in 150s-160s.  Pt reported feeling painful contractions only after delivery of Baby A. Pt received one dose of fentanyl for pain control. Pt was transferred to L&D suspected imminent delivery.  After arrival, pt was examined and head was still high.  Cerclage appeared intact on spec exam, and was removed.  There was no apparent bleeding from the cervix or visible lacerations.  After monitoring add'l approx 2 hours, pt continued to have painful contractions and requested epidural.  Minimal red bleeding was noted, totally approx 100cc. Given interval delivery planned, although suspect active labor, MgSO4 for neural protection was started.  Patient was handed off to oncoming on-call doctor in stable condition.

## 2020-10-31 NOTE — Progress Notes (Signed)
Dr. Chestine Spore notified of near syncope episode lasting 60s upon admission to [facility].  New orders LR 128ml/hr, CBC at 1500

## 2020-10-31 NOTE — Lactation Note (Signed)
This note was copied from a baby's chart. Lactation Consultation Note  Patient Name: Megan [name] [id number] Date: 10/31/2020 Reason for consult: Initial assessment;Preterm <34wks;NICU baby;Infant < 6lbs;Multiple gestation Age:28 hours   Initial visit to P2 mother of 10 hours old preterm twins currently in NICU. Mother states she pumped for 3-4 months. Mother has been proactively set up with a DEBP by RN. Mother is engorged. LC applied ice, massaged and mother able to collect ~5mL of EBM. Reinforced the importance of pumping as well as basics/cleaning/frequency. Encouraged to continue using ice on a off for 15-min and pump every 3 hours or sooner as needed.   Reviewed Breastfeeding a NICU baby booklet and lactation services information with mother and encouraged to contact [facility] for support and questions.  Mother is a [facility] participant during this pregnancy. Offered to submit a referral on baby's behalf for a Hospital grade pump due to NICU admission.    Maternal Data Has patient been taught Hand Expression?: No Does the patient have breastfeeding experience prior to this delivery?: Yes How long did the patient breastfeed?: 3-3 of pumping for first child (born at 64 weeks)  Feeding Mother's Current Feeding Choice: Breast Milk  Lactation Tools Discussed/Used Tools: Pump;Flanges Flange Size: 24 Breast pump type: Double-Electric Breast Pump;Manual Pump Education: Setup, frequency, and cleaning;Milk Storage Reason for Pumping: nicu baby Pumping frequency: Q3  Interventions Interventions: Breast feeding basics reviewed;Education;Hand express;Breast massage;Reverse pressure;DEBP;Ice;Hand pump  Discharge Discharge Education: Engorgement and breast care Pump: DEBP;Manual;Personal WIC Program: No  Consult Status Consult Status: Follow-up Date: 11/01/20 Follow-up type: In-patient    Candise Crabtree A Higuera Ancidey 10/31/2020, 7:49 PM

## 2020-10-31 NOTE — Progress Notes (Addendum)
Follow up visit with Megan [name] in her room in Labor and Delivery. Megan [name] shared that she went to the restroom this morning and could feel the baby's head. She was very anxious yesterday about having an early delivery after the loss of her son, Megan [name]. Megan [name]'s voice was shaky, but she reports she is trying to be strong. Chaplain offered reframing regarding strength reminding Megan [name] that sometimes we need to let things go in order to take in more. Chaplain suggested the analogy of a dishrag that isn't able to absorb anymore until it is wrung and suggested that crying isn't a sign of weakness, but can also facilitate strength. Megan [name] shared that she has been crying much of th morning and is just trying to pull herself together to see her babies as they were immediately taken to NICU after delivery. She reports the NICU team has them on breathing machines and they're breathing and doing well.  Megan [name] shared the names they're considering, but said they have not finalized them yet. Megan [name] was alone when I visited, waiting for her husband to return from grabbing them some food before going to meet her daughters. She expressed gratitude for the prayer we had yesterday and for the follow up visit. Pt requested prayer again today, which I provided along with a prayer shawl.  Please page as further needs arise.  [name]. [name], M.Div. [facility] Chaplain Pager [phone] Office [phone]

## 2020-10-31 NOTE — Anesthesia Preprocedure Evaluation (Signed)
Anesthesia Evaluation  Patient identified by MRN, date of birth, ID band Patient awake    Reviewed: Allergy & Precautions, Patient's Chart, lab work & pertinent test results  Airway Mallampati: II  TM Distance: >3 FB Neck ROM: Full    Dental no notable dental hx.    Pulmonary neg pulmonary ROS,    Pulmonary exam normal breath sounds clear to auscultation       Cardiovascular negative cardio ROS Normal cardiovascular exam Rhythm:Regular Rate:Normal     Neuro/Psych negative neurological ROS  negative psych ROS   GI/Hepatic negative GI ROS, Neg liver ROS,   Endo/Other  Obesity BMI 38  Renal/GU negative Renal ROS  negative genitourinary   Musculoskeletal negative musculoskeletal ROS (+)   Abdominal (+) + obese,   Peds negative pediatric ROS (+)  Hematology negative hematology ROS (+)   Anesthesia Other Findings   Reproductive/Obstetrics (+) Pregnancy Twin gestation, 1st fetus delivered in MAU 23 wks                              Anesthesia Physical Anesthesia Plan  ASA: 3 and emergent  Anesthesia Plan: Epidural   Post-op Pain Management:    Induction:   PONV Risk Score and Plan: 2  Airway Management Planned: Natural Airway  Additional Equipment: None  Intra-op Plan:   Post-operative Plan:   Informed Consent: I have reviewed the patients History and Physical, chart, labs and discussed the procedure including the risks, benefits and alternatives for the proposed anesthesia with the patient or authorized representative who has indicated his/her understanding and acceptance.       Plan Discussed with:   Anesthesia Plan Comments:         Anesthesia Quick Evaluation

## 2020-10-31 NOTE — Anesthesia Procedure Notes (Signed)
Epidural Patient location during procedure: OB Start time: 10/31/2020 7:30 AM End time: 10/31/2020 7:38 AM  Staffing Anesthesiologist: Lannie Fields, DO Performed: anesthesiologist   Preanesthetic Checklist Completed: patient identified, IV checked, risks and benefits discussed, monitors and equipment checked, pre-op evaluation and timeout performed  Epidural Patient position: sitting Prep: DuraPrep and site prepped and draped Patient monitoring: continuous pulse ox, blood pressure, heart rate and cardiac monitor Approach: midline Location: L3-L4 Injection technique: LOR air  Needle:  Needle type: Tuohy  Needle gauge: 17 G Needle length: 9 cm Needle insertion depth: 6 cm Catheter type: closed end flexible Catheter size: 19 Gauge Catheter at skin depth: 11 cm Test dose: negative  Assessment Sensory level: T8 Events: blood not aspirated, injection not painful, no injection resistance, no paresthesia and negative IV test  Additional Notes Patient identified. Risks/Benefits/Options discussed with patient including but not limited to bleeding, infection, nerve damage, paralysis, failed block, incomplete pain control, headache, blood pressure changes, nausea, vomiting, reactions to medication both or allergic, itching and postpartum back pain. Confirmed with bedside nurse the patient's most recent platelet count. Confirmed with patient that they are not currently taking any anticoagulation, have any bleeding history or any family history of bleeding disorders. Patient expressed understanding and wished to proceed. All questions were answered. Sterile technique was used throughout the entire procedure. Please see nursing notes for vital signs. Test dose was given through epidural catheter and negative prior to continuing to dose epidural or start infusion. Warning signs of high block given to the patient including shortness of breath, tingling/numbness in hands, complete motor  block, or any concerning symptoms with instructions to call for help. Patient was given instructions on fall risk and not to get out of bed. All questions and concerns addressed with instructions to call with any issues or inadequate analgesia.  Reason for block:procedure for pain

## 2020-11-01 ENCOUNTER — Ambulatory Visit: Payer: No Typology Code available for payment source

## 2020-11-01 ENCOUNTER — Ambulatory Visit: Payer: Self-pay

## 2020-11-01 LAB — CBC
HCT: 27.7 % — ABNORMAL LOW (ref 36.0–46.0)
Hemoglobin: 8.8 g/dL — ABNORMAL LOW (ref 12.0–15.0)
MCH: 24 pg — ABNORMAL LOW (ref 26.0–34.0)
MCHC: 31.8 g/dL (ref 30.0–36.0)
MCV: 75.5 fL — ABNORMAL LOW (ref 80.0–100.0)
Platelets: 345 10*3/uL (ref 150–400)
RBC: 3.67 MIL/uL — ABNORMAL LOW (ref 3.87–5.11)
RDW: 16.6 % — ABNORMAL HIGH (ref 11.5–15.5)
WBC: 17.3 10*3/uL — ABNORMAL HIGH (ref 4.0–10.5)
nRBC: 1.9 % — ABNORMAL HIGH (ref 0.0–0.2)

## 2020-11-01 MED ORDER — IBUPROFEN 600 MG PO TABS: 600.0000 mg | ORAL_TABLET | Freq: Four times a day (QID) | ORAL | 1 refills | Status: AC

## 2020-11-01 MED ORDER — ACETAMINOPHEN 325 MG PO TABS: 650.0000 mg | ORAL_TABLET | Freq: Four times a day (QID) | ORAL | 1 refills | Status: AC | PRN

## 2020-11-01 MED ORDER — SENNOSIDES-DOCUSATE SODIUM 8.6-50 MG PO TABS: 2.0000 | ORAL_TABLET | ORAL | 1 refills | Status: AC

## 2020-11-01 NOTE — Progress Notes (Signed)
Post Partum Day 1 Subjective: Reports doing ok, feeling concerned about babies in NICU, wants to get home to 27 year old son as well. Denies lightheadedness, ambulating independently, showered without issue. Tolerating PO. Reports bleeding is minimal.   Objective: Patient Vitals for the past 24 hrs:  BP Temp Temp src Pulse Resp SpO2  11/01/20 0801 119/65 98.1 F (36.7 C) Oral 69 18 98 %  11/01/20 0453 (!) 110/55 98.5 F (36.9 C) Oral 74 18 100 %  11/01/20 0022 111/64 98 F (36.7 C) Oral 74 18 97 %  10/31/20 2013 (!) 104/49 98 F (36.7 C) Oral 79 18 99 %  10/31/20 1609 114/66 98.3 F (36.8 C) -- 93 -- 98 %  10/31/20 1551 (!) 104/46 98 F (36.7 C) Oral 93 16 98 %  10/31/20 1311 -- -- -- -- -- 97 %  10/31/20 1306 -- -- -- -- -- 99 %  10/31/20 1301 113/61 -- -- 84 -- 100 %  10/31/20 1256 -- -- -- -- -- 100 %  10/31/20 1251 -- -- -- -- -- 100 %  10/31/20 1246 (!) 100/58 -- -- 74 -- 100 %  10/31/20 1241 -- -- -- -- -- 100 %  10/31/20 1236 -- -- -- -- -- 100 %  10/31/20 1231 (!) 100/54 -- -- 74 -- 97 %  10/31/20 1225 -- -- -- -- -- 100 %  10/31/20 1221 -- -- -- -- -- 99 %  10/31/20 1115 120/68 98.7 F (37.1 C) Axillary 80 16 --  10/31/20 1101 118/70 -- -- 79 -- --  10/31/20 1100 -- -- -- -- -- 98 %  10/31/20 1046 113/69 -- -- 81 -- --  10/31/20 1031 115/69 -- -- 82 -- --  10/31/20 1016 120/69 -- -- 77 -- 99 %  10/31/20 1001 115/67 -- -- 77 -- --  10/31/20 0955 109/66 97.6 F (36.4 C) Oral 77 17 --  10/31/20 0946 122/66 -- -- 83 -- --  10/31/20 0940 121/75 -- -- 86 -- --  10/31/20 0931 -- 98.9 F (37.2 C) Oral -- 16 --  10/31/20 0916 117/65 -- -- 92 -- 100 %  10/31/20 0914 115/62 -- -- 93 -- --  10/31/20 0901 123/72 -- -- 86 -- --    Physical Exam:  General: alert Lochia: appropriate Uterine Fundus: firm DVT Evaluation: No evidence of DVT seen on physical exam.  Recent Labs    10/30/20 1159 10/31/20 0913 10/31/20 1446 11/01/20 0612  WBC 22.8* 26.5* 24.9* 17.3*   HGB 8.5* 9.0* 8.6* 8.8*  HCT 26.8* 28.5* 26.7* 27.7*  PLT 436* 439*  436* 370 345   Recent Labs    10/31/20 0913  APTT 23*  INR 1.0    Recent Labs    10/31/20 0913  APTT 23*  INR 1.0  FIBRINOGEN 609*   Assessment/Plan: Discharge home  Megan [name] 25 y.o. [id number] PPD#1 sp SVD didi twins at [redacted]w[redacted]d for PPROM/PTL 1. PPC: no lacerations.  2. Postpartum hemorrhage: QBL 1000cc, thought to be abrupting at time of delivery baby B, received 1u pRBC. Hgb now stable 8.8. Did have near syncopal episode yesterday afternoon, now asymptomatic, hgb stable. Continue PO iron at discharge 3. Rubella Immune, blood type AB POS, pumping, babies in NICU, birth control: desires BTL (discussed LARCs vs. BTL, certain she does not desire future pregnancy). Vaccines: tdap accepted. Declines COVID series.   LOS: 8 days   Megan [name] 11/01/2020, 8:33 AM

## 2020-11-01 NOTE — Lactation Note (Signed)
This note was copied from a baby's chart. Lactation Consultation Note  Patient Name: Megan [name] [id number] Date: 11/01/2020   Age:27 hours  LC met with Mom as she was leaving her room on OBSC.  Mom has been pumping every 4 hrs and expressing 30 ml.   Mom states she has a pump for home use and knows that there is a [employer] DEBP in babies's room in the NICU for her to use.  Mom provided with a cooler and storage bottles.   Mom to obtain breast milk labels from NICU nurse. Mom denies any difficulty with pumping and is aware of lactation support available to her.   [name] 11/01/2020, 3:13 PM

## 2020-11-01 NOTE — Discharge Summary (Signed)
Postpartum Discharge Summary  Patient Name: Megan [name] DOB: 08/21/1995 MRN: [id number]  Date of admission: 10/24/2020 Delivery date:   Megan [name], Megan [name] [[id number]]  10/31/2020    Megan [name], Megan [name] [[id number]]  10/31/2020  Delivering provider:    Ace Gins Girl A Myrtice [[id number]]  Megan [name], Megan [name] [[id number]]  Megan [name]  Date of discharge: 11/01/2020  Admitting diagnosis: Preterm premature rupture of membranes (PPROM) with unknown onset of labor [O42.919] Intrauterine pregnancy: [redacted]w[redacted]d     Secondary diagnosis:  Active Problems:   Preterm premature rupture of membranes (PPROM) with unknown onset of labor   Postpartum hemorrhage  Additional problems: none    Discharge diagnosis: Preterm Pregnancy Delivered, Anemia, and PPH                                              Post partum procedures:blood transfusion Augmentation: N/A Complications: Placental Abruption and Hemorrhage>1084mL  Hospital course: Onset of Labor With Vaginal Delivery      25 y.o. yo [id number] at [redacted]w[redacted]d was admitted for previable PPROM on 10/24/2020. She was monitored on antepartum, received IV venofer for anemia, latency antibiotics, BMZ course, and then on 6/15 labored. Patient had a labor course as follows: Baby A precipitously delivery, cerclage was then removed and magnesium sulfate given. Interval delivery planned but due to active labor Baby B delivered spontaneously. Prior to delivery of baby B she had significant bleeding concerning for placenta abruption.  Membrane Rupture Time/Date:    Megan [name], Megan [name] [[id number]]  9:04 AM    Megan [name], Megan [name] [[id number]]  8:40 AM ,   Megan [name], Megan [name] [[id number]]  10/24/2020    Megan [name], Megan [name] [[id number]]  10/31/2020   Delivery Method:   Megan [name], Megan [name] [[id number]]  Vaginal, Spontaneous    Megan [name], Megan [name] [[id number]]  Vaginal, Spontaneous  Episiotomy:    Megan [name], Megan [name]  [[id number]]  None    Megan [name], Megan [name] [[id number]]  None  Lacerations:     Megan [name], Divito Girl A Idy [[id number]]  None    Anaka, Beazer [[id number]]  None  Shortly after delivery she received 1u PRBC given starting hgb 8.5 and postpartum hemorrhage. Afternoon PPD#0 had a near syncopal episode. Hgb was stable 8.8 on PPD#1. Patient otherwise had an uncomplicated postpartum course.  She is ambulating, tolerating a regular diet, passing flatus, and urinating well. Patient desired discharge home since she has a 38 year old son at home. Patient is discharged home in stable condition on 11/01/20.  Newborn Data: Birth date:   Latorsha, Curling [[id number]]  10/31/2020    Ivelis, Norgard Johnson [[id number]]  10/31/2020  Birth time:   Noelly, Lasseigne [[id number]]  4:51 AM    Nakita, [place]terre [place] [[id number]]  9:05 AM  Gender:   Adamariz, Gillott [[id number]]  Female    Shweta, Aman Savannah [[id number]]  Female  Living status:   Elberta, [place] [[id number]]  Living    [place], [place] [[id number]]  Living  Apgars:   Azaria, Stegman [[id number]]  [address] [place] [[id number]]  [place] ,   Aleighya, Mcanelly Girl MEGAN PRESTI [[id number]]  [place] [[id number]]  7  Weight:   Shabre, Kreher Girl A Caroljean [[id number]]  546 g  Lanette, [place] [[id number]]  440 g   Magnesium Sulfate received: Yes: Neuroprotection BMZ received: Yes Rhophylac:N/A MMR:N/A T-DaP:Given postpartum Flu: No Transfusion:Yes  Physical exam  Vitals:   10/31/20 2013 11/01/20 0022 11/01/20 0453 11/01/20 0801  BP: (!) 104/49 111/64 (!) 110/55 119/65  Pulse: 79 74 74 69  Resp: $Remo'18 18 18 18  'oVbKP$ Temp: 98 F (36.7 C) 98 F (36.7 C) 98.5 F (36.9 C) 98.1 F (36.7 C)  TempSrc: Oral Oral Oral Oral  SpO2: 99% 97% 100% 98%  Weight:      Height:       General: alert Lochia: appropriate Uterine Fundus: firm DVT Evaluation: No evidence of DVT seen on  physical exam. Labs: Lab Results  Component Value Date   WBC 17.3 (H) 11/01/2020   HGB 8.8 (L) 11/01/2020   HCT 27.7 (L) 11/01/2020   MCV 75.5 (L) 11/01/2020   PLT 345 11/01/2020   CMP Latest Ref Rng & Units 02/07/2014  Glucose 70 - 99 mg/dL 103(H)  BUN 6 - 23 mg/dL 11  Creatinine 0.50 - 1.10 mg/dL 0.77  Sodium 137 - 147 mEq/L 139  Potassium 3.7 - 5.3 mEq/L 4.4  Chloride 96 - 112 mEq/L 102  CO2 19 - 32 mEq/L 23  Calcium 8.4 - 10.5 mg/dL 9.7  Total Protein 6.0 - 8.3 g/dL 8.2  Total Bilirubin 0.3 - 1.2 mg/dL 0.4  Alkaline Phos 39 - 117 U/L 74  AST 0 - 37 U/L 16  ALT 0 - 35 U/L 17   Edinburgh Score: Edinburgh Postnatal Depression Scale Screening Tool 11/01/2020  I have been able to laugh and see the funny side of things. 1  I have looked forward with enjoyment to things. 0  I have blamed myself unnecessarily when things went wrong. 2  I have been anxious or worried for no good reason. 1  I have felt scared or panicky for no good reason. 1  Things have been getting on top of me. 1  I have been so unhappy that I have had difficulty sleeping. 1  I have felt sad or miserable. 1  I have been so unhappy that I have been crying. 1  The thought of harming myself has occurred to me. 0  Edinburgh Postnatal Depression Scale Total 9      After visit meds:  Allergies as of 11/01/2020   No Known Allergies      Medication List     STOP taking these medications    aspirin EC 81 MG tablet   progesterone 200 MG Supp       TAKE these medications    acetaminophen 325 MG tablet Commonly known as: Tylenol Take 2 tablets (650 mg total) by mouth every 6 (six) hours as needed. What changed:  when to take this reasons to take this   ferrous sulfate 325 (65 FE) MG tablet Take 1 tablet (325 mg total) by mouth daily with breakfast.   ibuprofen 600 MG tablet Commonly known as: ADVIL Take 1 tablet (600 mg total) by mouth every 6 (six) hours.   PRENATAL VITAMINS PO Take by  mouth.   senna-docusate 8.6-50 MG tablet Commonly known as: Senokot-S Take 2 tablets by mouth daily.         Discharge home in stable condition Infant Feeding: Breast Infant Disposition:NICU Discharge instruction: per After Visit Summary and Postpartum booklet. Activity: Advance as tolerated. Pelvic rest for 6 weeks.  Diet: routine diet Anticipated Birth Control: Plans Interval BTL Postpartum Appointment: 2-4  weeks Additional Postpartum F/U: Postpartum Depression checkup Future Appointments:No future appointments. Follow up Visit:  Follow-up Information     Megan Charles, MD Follow up.   Specialty: Obstetrics Why: Schedule postpartum visit in 2-4 weeks Contact information: [address] [address] [phone]                     11/01/2020 Jonelle Sidle, MD

## 2020-11-01 NOTE — Anesthesia Postprocedure Evaluation (Signed)
Anesthesia Post Note  Patient: Megan [name]  Procedure(s) Performed: AN AD HOC LABOR EPIDURAL     Patient location during evaluation: OB High Risk Anesthesia Type: Epidural Level of consciousness: awake, awake and alert and oriented Pain management: pain level controlled Vital Signs Assessment: post-procedure vital signs reviewed and stable Respiratory status: spontaneous breathing and respiratory function stable Cardiovascular status: blood pressure returned to baseline Postop Assessment: no headache, epidural receding, patient able to bend at knees, adequate PO intake, no backache, no apparent nausea or vomiting and able to ambulate Anesthetic complications: no   No notable events documented.  Last Vitals:  Vitals:   11/01/20 0453 11/01/20 0801  BP: (!) 110/55 119/65  Pulse: 74 69  Resp: 18 18  Temp: 36.9 C 36.7 C  SpO2: 100% 98%    Last Pain:  Vitals:   11/01/20 0801  TempSrc: Oral  PainSc:    Pain Goal: Patients Stated Pain Goal: 0 (10/29/20 1550)                 Cleda Clarks

## 2020-11-01 NOTE — Progress Notes (Signed)
Teaching complete  pt ambulated out

## 2020-11-01 NOTE — Plan of Care (Signed)
  Problem: Education: Goal: Knowledge of General Education information will improve Description: Including pain rating scale, medication(s)/side effects and non-pharmacologic comfort measures Outcome: Adequate for Discharge   

## 2020-11-02 LAB — TYPE AND SCREEN
ABO/RH(D): AB POS
Antibody Screen: NEGATIVE
Unit division: 0
Unit division: 0
Unit division: 0

## 2020-11-02 LAB — BPAM RBC
Blood Product Expiration Date: 202206282359
Blood Product Expiration Date: 202207092359
Blood Product Expiration Date: 202207132359
ISSUE DATE / TIME: 202206091016
ISSUE DATE / TIME: 202206091016
ISSUE DATE / TIME: 202206150913
Unit Type and Rh: 8400
Unit Type and Rh: 8400
Unit Type and Rh: 8400

## 2020-11-02 LAB — SURGICAL PATHOLOGY

## 2020-11-02 LAB — PATHOLOGIST SMEAR REVIEW

## 2020-11-06 ENCOUNTER — Ambulatory Visit: Payer: Self-pay

## 2020-11-06 NOTE — Lactation Note (Signed)
This note was copied from a baby's chart. Lactation Consultation Note  Patient Name: Megan [name] [id number]'[id number] Date: 11/06/2020 Reason for consult: Follow-up assessment;NICU baby;Preterm <34wks;Infant < 6lbs;Multiple gestation Age:27 days  Mom came back to the hospital, [facility] office did not issued a pump kit for her, LC gave mom a new pump kit. She said she's only been pumping twice a day because she only did it when at the hospital, she was unaware that she can take the tubing off the pump to take home and use it with her [facility] pump.  She also requested another belly band to take home, previous LC sized her with # 30 flanges, those were issued as well. Reviewed pumping schedule, supply & demand and lactogenesis II/III.  Feeding plan:  Encouraged mom to start pumping every 2-3 hours, ideally 8 pumping sessions/24 hours Mom prefers a weekly visit by the [facility] team  No other support person in mom's room at the time of [facility] consultation. Mom reported all questions and concerns were answered, she's aware of [place] OP services and will call PRN.  Maternal Data    Feeding Mother's Current Feeding Choice: Breast Milk  LATCH Score                    Lactation Tools Discussed/Used Tools: Pump;Flanges;Other (comment) (pumping band (belly band) size L) Flange Size: 27;30 Breast pump type: Double-Electric Breast Pump Pump Education: Setup, frequency, and cleaning;Milk Storage Reason for Pumping: pre-term NICU twins < 1 lbs Pumping frequency: q 12 hours (twice/day) Pumped volume: 60 mL  Interventions Interventions: Breast feeding basics reviewed;DEBP;Education  Discharge Pump: DEBP  Consult Status Consult Status: Follow-up Date: 11/10/20 Follow-up type: In-patient    Megan [name] 11/06/2020, 9:44 PM

## 2020-11-06 NOTE — Lactation Note (Signed)
This note was copied from [name] baby's chart. Lactation Consultation Note  Patient Name: Megan [name] Estefanie Cornforth Today's Date: 11/06/2020   Age:27 days  NICU secretary called [place] because mom wanted to see/talk to lactation. Mom was not in the hospital  yet, but at home. She said that she got her DEBP from [facility] but that she didn't get the pump parts. She still has the pump parts from her DEBP except for the tubing.  Explained to mom that we can issue [name] new DEBP kit for her since we don't carry separate parts. When mom found that there is [name] charge for the DEBP kit, she told LC that she'll be checking with the [facility] office to see if she can get one from them. LC gave her other options to try to get that part.  Lactation to F/U with mom once she gets to the hospital tonight around 8 pm.  Maternal Data    Feeding    LATCH Score                    Lactation Tools Discussed/Used    Interventions    Discharge    Consult Status      Uzziah Rigg Francene Boyers 11/06/2020, 4:29 PM
